# Patient Record
Sex: Female | Born: 1980 | Race: Black or African American | Hispanic: No | Marital: Married | Smoking: Current every day smoker
Health system: Southern US, Community
[De-identification: ages and names within clinical notes are randomized; demographics above are authoritative.]

## PROBLEM LIST (undated history)

## (undated) DIAGNOSIS — M542 Cervicalgia: Secondary | ICD-10-CM

## (undated) DIAGNOSIS — G8929 Other chronic pain: Secondary | ICD-10-CM

## (undated) DIAGNOSIS — R112 Nausea with vomiting, unspecified: Secondary | ICD-10-CM

## (undated) DIAGNOSIS — G459 Transient cerebral ischemic attack, unspecified: Secondary | ICD-10-CM

## (undated) DIAGNOSIS — Z9889 Other specified postprocedural states: Secondary | ICD-10-CM

## (undated) DIAGNOSIS — R0600 Dyspnea, unspecified: Secondary | ICD-10-CM

## (undated) DIAGNOSIS — Z8489 Family history of other specified conditions: Secondary | ICD-10-CM

## (undated) DIAGNOSIS — M549 Dorsalgia, unspecified: Secondary | ICD-10-CM

## (undated) DIAGNOSIS — J45909 Unspecified asthma, uncomplicated: Secondary | ICD-10-CM

## (undated) DIAGNOSIS — F419 Anxiety disorder, unspecified: Secondary | ICD-10-CM

## (undated) DIAGNOSIS — Z87442 Personal history of urinary calculi: Secondary | ICD-10-CM

## (undated) DIAGNOSIS — E039 Hypothyroidism, unspecified: Secondary | ICD-10-CM

## (undated) DIAGNOSIS — R519 Headache, unspecified: Secondary | ICD-10-CM

## (undated) DIAGNOSIS — I959 Hypotension, unspecified: Secondary | ICD-10-CM

## (undated) DIAGNOSIS — K219 Gastro-esophageal reflux disease without esophagitis: Secondary | ICD-10-CM

## (undated) DIAGNOSIS — G43909 Migraine, unspecified, not intractable, without status migrainosus: Secondary | ICD-10-CM

## (undated) DIAGNOSIS — M1990 Unspecified osteoarthritis, unspecified site: Secondary | ICD-10-CM

## (undated) HISTORY — PX: EYE SURGERY: SHX253

## (undated) HISTORY — DX: Headache, unspecified: R51.9

## (undated) HISTORY — PX: KNEE SURGERY: SHX244

## (undated) HISTORY — PX: TUBAL LIGATION: SHX77

---

## 2008-04-20 DIAGNOSIS — E890 Postprocedural hypothyroidism: Secondary | ICD-10-CM

## 2008-04-20 HISTORY — DX: Postprocedural hypothyroidism: E89.0

## 2018-04-23 ENCOUNTER — Other Ambulatory Visit: Payer: Self-pay

## 2018-04-23 ENCOUNTER — Emergency Department (HOSPITAL_COMMUNITY): Payer: Self-pay

## 2018-04-23 ENCOUNTER — Emergency Department (HOSPITAL_COMMUNITY)
Admission: EM | Admit: 2018-04-23 | Discharge: 2018-04-24 | Disposition: A | Discharge status: Home / Self Care | Payer: Self-pay | Attending: Emergency Medicine | Admitting: Emergency Medicine

## 2018-04-23 DIAGNOSIS — Z041 Encounter for examination and observation following transport accident: Secondary | ICD-10-CM | POA: Insufficient documentation

## 2018-04-23 DIAGNOSIS — M25562 Pain in left knee: Secondary | ICD-10-CM | POA: Insufficient documentation

## 2018-04-23 MED ORDER — ACETAMINOPHEN 325 MG PO TABS: 650.0000 mg | ORAL_TABLET | ORAL | Status: DC | PRN
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
## 2018-04-24 NOTE — ED Notes (Signed)
Patient verbalizes understanding of discharge instructions. Opportunity for questioning and answers were provided. Armband removed by staff, pt discharged from ED.  

## 2018-04-24 NOTE — Discharge Instructions (Signed)
For the knee pain, recommend ice, elevation, rest and tylenol or ibuprofen

## 2018-06-08 ENCOUNTER — Other Ambulatory Visit: Payer: Self-pay

## 2018-06-08 ENCOUNTER — Emergency Department (HOSPITAL_BASED_OUTPATIENT_CLINIC_OR_DEPARTMENT_OTHER)
Admission: EM | Admit: 2018-06-08 | Discharge: 2018-06-08 | Disposition: A | Discharge status: Home / Self Care | Payer: Medicaid Other | Attending: Emergency Medicine | Admitting: Emergency Medicine

## 2018-06-08 ENCOUNTER — Encounter (HOSPITAL_BASED_OUTPATIENT_CLINIC_OR_DEPARTMENT_OTHER): Payer: Self-pay | Admitting: Emergency Medicine

## 2018-06-08 ENCOUNTER — Emergency Department (HOSPITAL_BASED_OUTPATIENT_CLINIC_OR_DEPARTMENT_OTHER): Payer: Medicaid Other

## 2018-06-08 DIAGNOSIS — Z79899 Other long term (current) drug therapy: Secondary | ICD-10-CM | POA: Insufficient documentation

## 2018-06-08 DIAGNOSIS — Y9301 Activity, walking, marching and hiking: Secondary | ICD-10-CM | POA: Diagnosis not present

## 2018-06-08 DIAGNOSIS — Y929 Unspecified place or not applicable: Secondary | ICD-10-CM | POA: Insufficient documentation

## 2018-06-08 DIAGNOSIS — S8992XA Unspecified injury of left lower leg, initial encounter: Secondary | ICD-10-CM

## 2018-06-08 DIAGNOSIS — F17210 Nicotine dependence, cigarettes, uncomplicated: Secondary | ICD-10-CM | POA: Insufficient documentation

## 2018-06-08 DIAGNOSIS — M25562 Pain in left knee: Secondary | ICD-10-CM | POA: Diagnosis present

## 2018-06-08 DIAGNOSIS — W0110XA Fall on same level from slipping, tripping and stumbling with subsequent striking against unspecified object, initial encounter: Secondary | ICD-10-CM | POA: Insufficient documentation

## 2018-06-08 DIAGNOSIS — Y999 Unspecified external cause status: Secondary | ICD-10-CM | POA: Insufficient documentation

## 2018-06-08 DIAGNOSIS — Y92009 Unspecified place in unspecified non-institutional (private) residence as the place of occurrence of the external cause: Secondary | ICD-10-CM

## 2018-06-08 DIAGNOSIS — W19XXXA Unspecified fall, initial encounter: Secondary | ICD-10-CM

## 2018-06-08 HISTORY — DX: Migraine, unspecified, not intractable, without status migrainosus: G43.909

## 2018-06-08 HISTORY — DX: Transient cerebral ischemic attack, unspecified: G45.9

## 2018-06-08 HISTORY — DX: Other chronic pain: G89.29

## 2018-06-08 HISTORY — DX: Cervicalgia: M54.2

## 2018-06-08 HISTORY — DX: Dorsalgia, unspecified: M54.9

## 2018-06-08 HISTORY — DX: Hypotension, unspecified: I95.9

## 2018-06-08 MED ORDER — OXYCODONE HCL 5 MG PO TABS
10.0000 mg | ORAL_TABLET | Freq: Once | ORAL | Status: AC
  Administered 2018-06-08: 10 mg via ORAL
  Filled 2018-06-08: qty 2

## 2018-06-08 NOTE — ED Triage Notes (Signed)
Pt reports tripping and falling at 1930 yesterday, reports she tripped on a door frame and had immediate L leg pain. Reports she took some pain meds, applied ice and went to bed. Reports L leg numbness, increased pain with weight bearing.

## 2018-06-08 NOTE — ED Provider Notes (Signed)
[facility] Provider Note: Lowella Dell, MD, FACEP  CSN: [id number] MRN: [id number] ARRIVAL: 06/08/18 at 0320 ROOM: MH02/MH02   CHIEF COMPLAINT  Fall   HISTORY OF PRESENT ILLNESS  06/08/18 3:48 AM Rebecca Reilly is a 38 y.o. female on chronic oxycodone for chronic pain.  She fell yesterday evening about 7:30 PM, tripping over a threshold.  She is complaining of severe pain in her left knee.  The pain is located in the medial left knee and is worse with movement or attempted weightbearing.  She states she is not able to bear weight on it.  She is also having numbness in the left leg.  She has chronic back pain which she states has not significantly changed from her baseline.    Past Medical History:  Diagnosis Date  . Back pain, chronic    bulging disc  . Hypotension   . Migraines   . Neck pain, chronic    buldging disc  . TIA (transient ischemic attack)    Right sided weakness    Past Surgical History:  Procedure Laterality Date  . KNEE SURGERY Right     No family history on file.  Social History   Tobacco Use  . Smoking status: Current Every Day Smoker    Packs/day: 1.00    Types: Cigarettes  . Smokeless tobacco: Never Used  Substance Use Topics  . Alcohol use: Not Currently  . Drug use: Not Currently    Prior to Admission medications   Medication Sig Start Date End Date Taking? Authorizing Provider  clonazePAM (KLONOPIN) 0.5 MG tablet Take by mouth. 11/27/17  Yes [provider]  TOPIRAMATE PO Take by mouth.   Yes [provider]  diclofenac sodium (VOLTAREN) 1 % GEL APPLY 4 GRAM TO LOWER BACK AS NEEDED EVERY 8 HOURS (30 DAY SUPPLY) 03/18/18   [provider]  gabapentin (NEURONTIN) 100 MG capsule TAKE ONE CAPSULE BY MOUTH EVERY 8 HOURS 04/18/18   [provider]  Oxycodone HCl 10 MG TABS Take 10 mg by mouth every 6 (six) hours as needed. for pain 05/20/18   [provider]     Allergies Morphine   REVIEW OF SYSTEMS  Negative except as noted here or in the History of Present Illness.   PHYSICAL EXAMINATION  Initial Vital Signs Blood pressure 97/63, pulse 98, temperature 98.6 F (37 C), temperature source Oral, resp. rate 20, height 5\' 7"  (1.702 m), weight 87.5 kg, last menstrual period 05/25/2018, SpO2 98 %.  Examination General: Well-developed, well-nourished female in no acute distress; appearance consistent with age of record HENT: normocephalic; atraumatic Eyes: pupils equal, round and reactive to light; extraocular muscles intact Neck: supple Heart: regular rate and rhythm; no murmurs, rubs or gallops Lungs: clear to auscultation bilaterally Abdomen: soft; nondistended; nontender; bowel sounds present Extremities: No deformity; tenderness of medial left knee with pain on attempted range of motion, no ecchymosis or swelling appreciated; pulses normal Neurologic: Awake, alert and oriented; motor function intact in all extremities and symmetric; no facial droop Skin: Warm and dry Psychiatric: Flat affect   RESULTS  Summary of this visit's results, reviewed by myself:   EKG Interpretation  Date/Time:    Ventricular Rate:    PR Interval:    QRS Duration:   QT Interval:    QTC Calculation:   R Axis:     Text Interpretation:        Laboratory Studies: No results found for this or any previous visit (from  the past 24 hour(s)). Imaging Studies: Dg Knee Complete 4 Views Left  Result Date: 06/08/2018 CLINICAL DATA:  Fall 9 hours ago with left knee pain. EXAM: LEFT KNEE - COMPLETE 4+ VIEW COMPARISON:  04/23/2018 FINDINGS: No evidence of acute fracture, dislocation, or joint effusion. On the lateral view 1 of the anterior femoral condyle shows a concavity that is chronic. No evidence of arthropathy or other focal bone abnormality. Soft tissues are unremarkable. IMPRESSION: 1. No acute finding. 2. Concavity of 1 of the femoral condyles  anteriorly which could be developmental or from a remote impaction injury. Electronically Signed   By: Marnee Spring M.D.   On: 06/08/2018 04:30    ED COURSE and MDM  Nursing notes and initial vitals signs, including pulse oximetry, reviewed.  Vitals:   06/08/18 0325 06/08/18 0330  BP: 97/63   Pulse: 98   Resp: 20   Temp: 98.6 F (37 C)   TempSrc: Oral   SpO2: 98%   Weight:  87.5 kg  Height:  5\' 7"  (1.702 m)   Patient is already on chronic narcotics and understands that she cannot get any additional prescriptions.  We will try a knee immobilizer and crutches to help her ambulate.  She already has a referral to an orthopedist, appointment pending.  PROCEDURES    ED DIAGNOSES     ICD-10-CM   1. Fall in home, initial encounter W19.XXXA    Y92.009   2. Left knee injury, initial encounter S89.92XA        Hazley Dezeeuw, Jonny Ruiz, MD 06/08/18 [phone]

## 2018-06-08 NOTE — ED Notes (Signed)
Patient transported to X-ray 

## 2018-08-19 ENCOUNTER — Other Ambulatory Visit: Payer: Self-pay | Admitting: Nurse Practitioner

## 2018-10-27 ENCOUNTER — Other Ambulatory Visit: Payer: Medicaid Other

## 2018-11-19 ENCOUNTER — Other Ambulatory Visit: Payer: Self-pay

## 2018-11-19 ENCOUNTER — Other Ambulatory Visit: Payer: Medicaid Other

## 2018-12-13 ENCOUNTER — Other Ambulatory Visit: Payer: Self-pay

## 2018-12-13 ENCOUNTER — Ambulatory Visit
Admission: RE | Admit: 2018-12-13 | Discharge: 2018-12-13 | Disposition: A | Discharge status: Home / Self Care | Payer: Medicaid Other | Source: Ambulatory Visit | Attending: Nurse Practitioner | Admitting: Nurse Practitioner

## 2018-12-14 ENCOUNTER — Other Ambulatory Visit: Payer: Medicaid Other

## 2018-12-14 ENCOUNTER — Ambulatory Visit
Admission: RE | Admit: 2018-12-14 | Discharge: 2018-12-14 | Disposition: A | Discharge status: Home / Self Care | Payer: Medicaid Other | Source: Ambulatory Visit | Attending: Nurse Practitioner | Admitting: Nurse Practitioner

## 2019-01-09 DIAGNOSIS — Z5321 Procedure and treatment not carried out due to patient leaving prior to being seen by health care provider: Secondary | ICD-10-CM | POA: Insufficient documentation

## 2019-01-09 DIAGNOSIS — N200 Calculus of kidney: Secondary | ICD-10-CM | POA: Diagnosis not present

## 2019-01-10 ENCOUNTER — Emergency Department (HOSPITAL_COMMUNITY)
Admission: EM | Admit: 2019-01-10 | Discharge: 2019-01-10 | Disposition: A | Discharge status: Home / Self Care | Payer: Medicaid Other | Attending: Emergency Medicine | Admitting: Emergency Medicine

## 2019-01-10 NOTE — ED Notes (Signed)
PT stated she would go somewhere else because nobody has come to triage her. [facility] and to for her husband to take her somewhere else.

## 2019-01-11 ENCOUNTER — Emergency Department (HOSPITAL_COMMUNITY)
Admission: EM | Admit: 2019-01-11 | Discharge: 2019-01-11 | Disposition: A | Discharge status: Home / Self Care | Payer: Medicaid Other | Attending: Emergency Medicine | Admitting: Emergency Medicine

## 2019-01-11 ENCOUNTER — Encounter (HOSPITAL_COMMUNITY): Payer: Self-pay | Admitting: Emergency Medicine

## 2019-01-11 ENCOUNTER — Emergency Department (HOSPITAL_COMMUNITY)
Admission: EM | Admit: 2019-01-11 | Discharge: 2019-01-11 | Discharge status: Against Medical Advice | Payer: Medicaid Other | Source: Home / Self Care

## 2019-01-11 ENCOUNTER — Other Ambulatory Visit: Payer: Self-pay

## 2019-01-11 ENCOUNTER — Emergency Department (HOSPITAL_COMMUNITY): Payer: Medicaid Other

## 2019-01-11 DIAGNOSIS — F17210 Nicotine dependence, cigarettes, uncomplicated: Secondary | ICD-10-CM | POA: Diagnosis not present

## 2019-01-11 DIAGNOSIS — Z5321 Procedure and treatment not carried out due to patient leaving prior to being seen by health care provider: Secondary | ICD-10-CM | POA: Insufficient documentation

## 2019-01-11 DIAGNOSIS — R110 Nausea: Secondary | ICD-10-CM | POA: Insufficient documentation

## 2019-01-11 DIAGNOSIS — R1032 Left lower quadrant pain: Secondary | ICD-10-CM | POA: Diagnosis present

## 2019-01-11 DIAGNOSIS — Z79899 Other long term (current) drug therapy: Secondary | ICD-10-CM | POA: Insufficient documentation

## 2019-01-11 DIAGNOSIS — N390 Urinary tract infection, site not specified: Secondary | ICD-10-CM | POA: Insufficient documentation

## 2019-01-11 DIAGNOSIS — R10812 Left upper quadrant abdominal tenderness: Secondary | ICD-10-CM | POA: Insufficient documentation

## 2019-01-11 LAB — CBC WITH DIFFERENTIAL/PLATELET
Abs Immature Granulocytes: 0.03 10*3/uL (ref 0.00–0.07)
Basophils Absolute: 0 10*3/uL (ref 0.0–0.1)
Basophils Relative: 0 %
Eosinophils Absolute: 0.3 10*3/uL (ref 0.0–0.5)
Eosinophils Relative: 2 %
HCT: 43.4 % (ref 36.0–46.0)
Hemoglobin: 14 g/dL (ref 12.0–15.0)
Immature Granulocytes: 0 %
Lymphocytes Relative: 23 %
Lymphs Abs: 2.8 10*3/uL (ref 0.7–4.0)
MCH: 32 pg (ref 26.0–34.0)
MCHC: 32.3 g/dL (ref 30.0–36.0)
MCV: 99.1 fL (ref 80.0–100.0)
Monocytes Absolute: 1 10*3/uL (ref 0.1–1.0)
Monocytes Relative: 8 %
Neutro Abs: 8.3 10*3/uL — ABNORMAL HIGH (ref 1.7–7.7)
Neutrophils Relative %: 67 %
Platelets: 231 10*3/uL (ref 150–400)
RBC: 4.38 MIL/uL (ref 3.87–5.11)
RDW: 12.9 % (ref 11.5–15.5)
WBC: 12.5 10*3/uL — ABNORMAL HIGH (ref 4.0–10.5)
nRBC: 0 % (ref 0.0–0.2)

## 2019-01-11 LAB — BASIC METABOLIC PANEL
Anion gap: 8 (ref 5–15)
BUN: 15 mg/dL (ref 6–20)
CO2: 19 mmol/L — ABNORMAL LOW (ref 22–32)
Calcium: 9.1 mg/dL (ref 8.9–10.3)
Chloride: 110 mmol/L (ref 98–111)
Creatinine, Ser: 1 mg/dL (ref 0.44–1.00)
GFR calc Af Amer: >60 mL/min (ref >60)
GFR calc non Af Amer: >60 mL/min (ref >60)
Glucose, Bld: 113 mg/dL — ABNORMAL HIGH (ref 70–99)
Potassium: 4 mmol/L (ref 3.5–5.1)
Sodium: 137 mmol/L (ref 135–145)

## 2019-01-11 LAB — HEPATIC FUNCTION PANEL
ALT: 11 U/L (ref 0–44)
AST: 16 U/L (ref 15–41)
Albumin: 3.6 g/dL (ref 3.5–5.0)
Alkaline Phosphatase: 71 U/L (ref 38–126)
Bilirubin, Direct: 0.1 mg/dL (ref 0.0–0.2)
Indirect Bilirubin: 0.2 mg/dL — ABNORMAL LOW (ref 0.3–0.9)
Total Bilirubin: 0.3 mg/dL (ref 0.3–1.2)
Total Protein: 6.6 g/dL (ref 6.5–8.1)

## 2019-01-11 LAB — URINALYSIS, ROUTINE W REFLEX MICROSCOPIC
Bilirubin Urine: NEGATIVE
Glucose, UA: NEGATIVE mg/dL
Ketones, ur: NEGATIVE mg/dL
Nitrite: NEGATIVE
Protein, ur: NEGATIVE mg/dL
Specific Gravity, Urine: 1.029 (ref 1.005–1.030)
pH: 5 (ref 5.0–8.0)

## 2019-01-11 LAB — I-STAT BETA HCG BLOOD, ED (MC, WL, AP ONLY): I-stat hCG, quantitative: <5 m[IU]/mL (ref <5)

## 2019-01-11 LAB — LIPASE, BLOOD: Lipase: 42 U/L (ref 11–51)

## 2019-01-11 MED ORDER — PROCHLORPERAZINE EDISYLATE 10 MG/2ML IJ SOLN
10.0000 mg | Freq: Once | INTRAMUSCULAR | Status: AC
  Administered 2019-01-11: 09:00:00 10 mg via INTRAVENOUS
  Filled 2019-01-11: qty 2

## 2019-01-11 MED ORDER — SODIUM CHLORIDE 0.9 % IV BOLUS
500.0000 mL | Freq: Once | INTRAVENOUS | Status: AC
  Administered 2019-01-11: 500 mL via INTRAVENOUS

## 2019-01-11 MED ORDER — CEPHALEXIN 500 MG PO CAPS
500.0000 mg | ORAL_CAPSULE | Freq: Once | ORAL | Status: AC
  Administered 2019-01-11: 11:00:00 500 mg via ORAL
  Filled 2019-01-11: qty 1

## 2019-01-11 MED ORDER — SODIUM CHLORIDE 0.9 % IV BOLUS
1000.0000 mL | Freq: Once | INTRAVENOUS | Status: AC
  Administered 2019-01-11: 09:00:00 1000 mL via INTRAVENOUS

## 2019-01-11 MED ORDER — CEPHALEXIN 500 MG PO CAPS: 500.0000 mg | ORAL_CAPSULE | Freq: Two times a day (BID) | ORAL | 0 refills | Status: AC

## 2019-01-11 MED ORDER — HYDROMORPHONE HCL 1 MG/ML IJ SOLN
1.0000 mg | Freq: Once | INTRAMUSCULAR | Status: AC
  Administered 2019-01-11: 1 mg via INTRAVENOUS
  Filled 2019-01-11: qty 1

## 2019-01-11 NOTE — ED Triage Notes (Signed)
Pt having abd pains that radiates to left leg x 2 days with nausea.

## 2019-01-11 NOTE — ED Triage Notes (Signed)
Per [facility] pt from home for a syncopal episode while sitting on the toilet today. Pt has hematoma to right side of head, hip pains-right.  Vitals: 122 palpated, cbg 99, 97 temp, HR 60.

## 2019-01-11 NOTE — ED Notes (Signed)
Curat[place],MD made aware of patients low BP. 80's/60's.

## 2019-01-11 NOTE — ED Provider Notes (Signed)
[facility]-EMERGENCY DEPT Provider Note   CSN: [id number] Arrival date & time: 01/11/19  7106     History   Chief Complaint Chief Complaint  Patient presents with  . Abdominal Pain    HPI Rebecca Reilly is a 38 y.o. female.     The history is provided by the patient.  Abdominal Pain Pain location:  L flank, LUQ and LLQ Pain quality: aching and dull   Pain radiates to:  L leg Pain severity:  Mild Onset quality:  Gradual Duration:  3 days Timing:  Intermittent Progression:  Waxing and waning Chronicity:  New Context: not eating and not recent illness   Relieved by:  OTC medications (home narcotics) Worsened by:  Movement and palpation Associated symptoms: nausea   Associated symptoms: no chest pain, no chills, no constipation, no cough, no diarrhea, no dysuria, no fever, no hematuria, no shortness of breath, no sore throat and no vomiting   Risk factors: has not had multiple surgeries and not pregnant     Past Medical History:  Diagnosis Date  . Back pain, chronic    bulging disc  . Hypotension   . Migraines   . Neck pain, chronic    buldging disc  . TIA (transient ischemic attack)    Right sided weakness    There are no active problems to display for this patient.   Past Surgical History:  Procedure Laterality Date  . KNEE SURGERY Right      OB History   No obstetric history on file.      Home Medications    Prior to Admission medications   Medication Sig Start Date End Date Taking? Authorizing Provider  acetaminophen (TYLENOL) 500 MG tablet Take 500 mg by mouth every 6 (six) hours as needed for mild pain.   Yes [provider]  gabapentin (NEURONTIN) 100 MG capsule Take 100 mg by mouth 3 (three) times daily.  04/18/18  Yes [provider]  ibuprofen (ADVIL) 200 MG tablet Take 800 mg by mouth every 8 (eight) hours as needed for pain.   Yes [provider]  levalbuterol (XOPENEX HFA) 45 MCG/ACT  inhaler Inhale 1 puff into the lungs every 4 (four) hours as needed for wheezing.   Yes [provider]  Oxycodone HCl 10 MG TABS Take 10 mg by mouth every 6 (six) hours as needed (for pain).  05/20/18  Yes [provider]  cephALEXin (KEFLEX) 500 MG capsule Take 1 capsule (500 mg total) by mouth 2 (two) times daily for 5 days. 01/11/19 01/16/19  Virgina Norfolk, DO    Family History No family history on file.  Social History Social History   Tobacco Use  . Smoking status: Current Every Day Smoker    Packs/day: 1.00    Types: Cigarettes  . Smokeless tobacco: Never Used  Substance Use Topics  . Alcohol use: Not Currently  . Drug use: Not Currently     Allergies   Morphine   Review of Systems Review of Systems  Constitutional: Negative for chills and fever.  HENT: Negative for ear pain and sore throat.   Eyes: Negative for pain and visual disturbance.  Respiratory: Negative for cough and shortness of breath.   Cardiovascular: Negative for chest pain and palpitations.  Gastrointestinal: Positive for abdominal pain and nausea. Negative for abdominal distention, anal bleeding, blood in stool, constipation, diarrhea and vomiting.  Genitourinary: Negative for dysuria and hematuria.  Musculoskeletal: Negative for arthralgias and back pain.  Skin:  Negative for color change and rash.  Neurological: Negative for seizures and syncope.  All other systems reviewed and are negative.    Physical Exam Updated Vital Signs  ED Triage Vitals  Enc Vitals Group     BP 01/11/19 0754 101/72     Pulse Rate 01/11/19 0754 81     Resp 01/11/19 0754 18     Temp 01/11/19 0756 98.3 F (36.8 C)     Temp Source 01/11/19 0756 Oral     SpO2 01/11/19 0754 98 %     Weight --      Height --      Head Circumference --      Peak Flow --      Pain Score 01/11/19 0755 10     Pain Loc --      Pain Edu? --      Excl. in [place]? --     Physical Exam Vitals signs and nursing note  reviewed.  Constitutional:      General: She is not in acute distress.    Appearance: She is well-developed.  HENT:     Head: Normocephalic and atraumatic.  Eyes:     Extraocular Movements: Extraocular movements intact.     Conjunctiva/sclera: Conjunctivae normal.     Pupils: Pupils are equal, round, and reactive to light.  Neck:     Musculoskeletal: Neck supple.  Cardiovascular:     Rate and Rhythm: Normal rate and regular rhythm.     Heart sounds: Normal heart sounds. No murmur.  Pulmonary:     Effort: Pulmonary effort is normal. No respiratory distress.     Breath sounds: Normal breath sounds.  Abdominal:     General: Abdomen is flat. Bowel sounds are normal. There is no distension.     Palpations: Abdomen is soft.     Tenderness: There is abdominal tenderness in the left upper quadrant and left lower quadrant. There is left CVA tenderness. There is no right CVA tenderness or guarding. Negative signs include Murphy's sign, McBurney's sign and psoas sign.  Skin:    General: Skin is warm and dry.  Neurological:     Mental Status: She is alert.      ED Treatments / Results  Labs (all labs ordered are listed, but only abnormal results are displayed) Labs Reviewed  CBC WITH DIFFERENTIAL/PLATELET - Abnormal; Notable for the following components:      Result Value   WBC 12.5 (*)    Neutro Abs 8.3 (*)    All other components within normal limits  BASIC METABOLIC PANEL - Abnormal; Notable for the following components:   CO2 19 (*)    Glucose, Bld 113 (*)    All other components within normal limits  HEPATIC FUNCTION PANEL - Abnormal; Notable for the following components:   Indirect Bilirubin 0.2 (*)    All other components within normal limits  URINALYSIS, ROUTINE W REFLEX MICROSCOPIC - Abnormal; Notable for the following components:   APPearance CLOUDY (*)    Hgb urine dipstick SMALL (*)    Leukocytes,Ua SMALL (*)    Bacteria, UA MANY (*)    All other components within  normal limits  URINE CULTURE  LIPASE, BLOOD  I-STAT BETA HCG BLOOD, ED ([facility], [facility], [facility] ONLY)    EKG None  Radiology Ct Renal Stone Study  Result Date: 01/11/2019 CLINICAL DATA:  Flank pain. Left-sided flank pain that radiates to left lower extremity. EXAM: CT ABDOMEN AND PELVIS WITHOUT CONTRAST TECHNIQUE: Multidetector CT  imaging of the abdomen and pelvis was performed following the standard protocol without IV contrast. COMPARISON:  None. FINDINGS: Lower chest: The lung bases are clear. The heart size is normal. Hepatobiliary: The liver is normal. Normal gallbladder.There is no biliary ductal dilation. Pancreas: Normal contours without ductal dilatation. No peripancreatic fluid collection. Spleen: No splenic laceration or hematoma. Adrenals/Urinary Tract: --Adrenal glands: No adrenal hemorrhage. --Right kidney/ureter: No hydronephrosis or perinephric hematoma. --Left kidney/ureter: No hydronephrosis or perinephric hematoma. --Urinary bladder: Unremarkable. Stomach/Bowel: --Stomach/Duodenum: No hiatal hernia or other gastric abnormality. Normal duodenal course and caliber. --Small bowel: No dilatation or inflammation. --Colon: No focal abnormality. --Appendix: Normal. Vascular/Lymphatic: Normal course and caliber of the major abdominal vessels. --No retroperitoneal lymphadenopathy. --No mesenteric lymphadenopathy. --No pelvic or inguinal lymphadenopathy. Reproductive: There are multiple low-attenuation areas in the uterine fundus favored to represent fibroids. Other: No ascites or free air. The abdominal wall is normal. Musculoskeletal. No acute displaced fractures. IMPRESSION: 1. No acute abnormality detected. No radiopaque kidney stones. No hydronephrosis. 2. Normal appendix in the right lower quadrant. 3. Fibroid uterus. Electronically Signed   By: Katherine Mantle M.D.   On: 01/11/2019 10:23    Procedures Procedures (including critical care time)  Medications Ordered in ED Medications  sodium  chloride 0.9 % bolus 1,000 mL (0 mLs Intravenous Stopped 01/11/19 0946)  prochlorperazine (COMPAZINE) injection 10 mg (10 mg Intravenous Given 01/11/19 0843)  HYDROmorphone (DILAUDID) injection 1 mg (1 mg Intravenous Given 01/11/19 0838)  sodium chloride 0.9 % bolus 500 mL (0 mLs Intravenous Stopped 01/11/19 1135)  cephALEXin (KEFLEX) capsule 500 mg (500 mg Oral Given 01/11/19 1057)     Initial Impression / Assessment and Plan / ED Course  I have reviewed the triage vital signs and the nursing notes.  Pertinent labs & imaging results that were available during my care of the patient were reviewed by me and considered in my medical decision making (see chart for details).     Rebecca Reilly is a 38 year old female with history of chronic neck, chronic back pain, IBS who presents to the ED with left-sided abdominal pain for the last several days.  Patient also with history of kidney stones.  Patient denies any urinary symptoms but has had nausea.  No hematuria.  Pain intermittent on the left side sometimes radiates into her left leg but she states that could be from her chronic back pain.  She denies any loss of bowel or bladder.  No worsening back pain than normal.  No vaginal discharge or concern for STDs.  Patient with tenderness most with the left side of the abdomen and left CVA.  Possibly kidney stone versus pyelonephritis versus colitis versus bowel obstruction.  Could be IBS flare.  Patient is on her menstrual cycle and states that she does get pretty bad cramps that time as well but seems a little bit stronger than normal.  Will evaluate with lab work, CT scan of the abdomen and pelvis.  Will give IV Compazine, IV Dilaudid, normal saline bolus.  Patient has tried Tylenol, Motrin, home narcotics with minimal relief.  Will reevaluate.]  Urinalysis possibly with infection.  Will treat with Keflex.  However no signs of systemic infection.  No fever.  Mild leukocytosis at 12.5.  However kidney function  within normal limits.  No significant electrolyte abnormalities.  No significant anemia.  Pregnancy test negative. CT unremarkable except for fibroid. Possible UTI and will treat. Patient felt better after fluids and pain medicine. Discharged in good condition, had one  low BP but was positional, asymptomic and normal when cuff adjusted. Recommend follow up with PCP.   This chart was dictated using voice recognition software.  Despite best efforts to proofread,  errors can occur which can change the documentation meaning.    Final Clinical Impressions(s) / ED Diagnoses   Final diagnoses:  Urinary tract infection without hematuria, site unspecified    ED Discharge Orders         Ordered    cephALEXin (KEFLEX) 500 MG capsule  2 times daily     01/11/19 0913           Virgina NorfolkCuratolo, Vera Wishart, DO 01/11/19 1226

## 2019-01-11 NOTE — ED Notes (Signed)
Lab called to add on urine culture.  ?

## 2019-01-11 NOTE — ED Notes (Signed)
PT WALKED UP TO WINDOW. PT ENDORSES HER PCP IS GOING TO SEE HER RIGHT NOW AND REQUEST TO LEAVE. SHE IS NOT UPSET AND UNDERSTANDS OUR WAIT. SHE IS ALERT AND ACTIVE WITH CARE AND HAS A RIDE.

## 2019-01-11 NOTE — ED Notes (Signed)
CT at bedside for transport

## 2019-01-12 LAB — URINE CULTURE

## 2019-01-12 LAB — CBG MONITORING, ED: Glucose-Capillary: 100 mg/dL — ABNORMAL HIGH (ref 70–99)

## 2019-02-03 ENCOUNTER — Other Ambulatory Visit: Payer: Self-pay | Admitting: Neurosurgery

## 2019-02-28 NOTE — Pre-Procedure Instructions (Signed)
Rebecca Reilly [facility] [address] [place] - [address] [place] [address] Phone: [phone] Fax: [phone]      Your procedure is scheduled on Friday, November, 13th.  Report to [facility] Main Entrance "A" at 05:30 A.M., and check in at the Admitting office.  Call this number if you have problems the morning of surgery:  [phone]    Remember:  Do not eat or drink after midnight the night before your surgery     Take these medicines the morning of surgery with A SIP OF WATER  IF NEEDED: acetaminophen (TYLENOL)  gabapentin (NEURONTIN)  Oxycodone HCl  DUREZOL 0.05 % Eye drop ketorolac (ACULAR) 0.5 % ophthalmic solution VIGAMOX 0.5 % ophthalmic solution  levalbuterol (XOPENEX HFA) inhaler Please bring all inhalers with you the day of surgery.     As of today, STOP taking any Aspirin (unless otherwise instructed by your surgeon), Aleve, Naproxen, Ibuprofen, Motrin, Advil, Goody's, BC's, all herbal medications, fish oil, and all vitamins.    The Morning of Surgery  Do not wear jewelry, make-up or nail polish.  Do not wear lotions, powders, perfumes, or deodorant  Do not shave 48 hours prior to surgery.    Do not bring valuables to the hospital.  [facility] is not responsible for any belongings or valuables.  If you are a smoker, DO NOT Smoke 24 hours prior to surgery IF you wear a CPAP at night please bring your mask, tubing, and machine the morning of surgery   Remember that you must have someone to transport you home after your surgery, and remain with you for 24 hours if you are discharged the same day.   Contacts, glasses, hearing aids, dentures or bridgework may not be worn into surgery.    Leave your suitcase in the car.  After surgery it may be brought to your room.  For patients admitted to the hospital, discharge time will be determined by your treatment team.  Patients discharged the day of surgery will not be  allowed to drive home.    Special instructions:   [facility]- Preparing For Surgery  Before surgery, you can play an important role. Because skin is not sterile, your skin needs to be as free of germs as possible. You can reduce the number of germs on your skin by washing with CHG (chlorahexidine gluconate) Soap before surgery.  CHG is an antiseptic cleaner which kills germs and bonds with the skin to continue killing germs even after washing.    Oral Hygiene is also important to reduce your risk of infection.  Remember - BRUSH YOUR TEETH THE MORNING OF SURGERY WITH YOUR REGULAR TOOTHPASTE  Please do not use if you have an allergy to CHG or antibacterial soaps. If your skin becomes reddened/irritated stop using the CHG.  Do not shave (including legs and underarms) for at least 48 hours prior to first CHG shower. It is OK to shave your face.  Please follow these instructions carefully.   1. Shower the NIGHT BEFORE SURGERY and the MORNING OF SURGERY with CHG Soap.   2. If you chose to wash your hair, wash your hair first as usual with your normal shampoo.  3. After you shampoo, rinse your hair and body thoroughly to remove the shampoo.  4. Use CHG as you would any other liquid soap. You can apply CHG directly to the skin and wash gently with a scrungie or a clean washcloth.   5.  Apply the CHG Soap to your body ONLY FROM THE NECK DOWN.  Do not use on open wounds or open sores. Avoid contact with your eyes, ears, mouth and genitals (private parts). Wash Face and genitals (private parts)  with your normal soap.   6. Wash thoroughly, paying special attention to the area where your surgery will be performed.  7. Thoroughly rinse your body with warm water from the neck down.  8. DO NOT shower/wash with your normal soap after using and rinsing off the CHG Soap.  9. Pat yourself dry with a CLEAN TOWEL.  10. Wear CLEAN PAJAMAS to bed the night before surgery, wear comfortable clothes the  morning of surgery  11. Place CLEAN SHEETS on your bed the night of your first shower and DO NOT SLEEP WITH PETS.    Day of Surgery:  Do not apply any deodorants/lotions. Please shower the morning of surgery with the CHG soap  Please wear clean clothes to the hospital/surgery center.   Remember to brush your teeth WITH YOUR REGULAR TOOTHPASTE.   Please read over the following fact sheets that you were given.

## 2019-03-01 ENCOUNTER — Encounter (HOSPITAL_COMMUNITY)
Admission: RE | Admit: 2019-03-01 | Discharge: 2019-03-01 | Disposition: A | Discharge status: Home / Self Care | Payer: Medicaid Other | Source: Ambulatory Visit | Attending: Neurosurgery | Admitting: Neurosurgery

## 2019-03-01 ENCOUNTER — Other Ambulatory Visit (HOSPITAL_COMMUNITY)
Admission: RE | Admit: 2019-03-01 | Discharge: 2019-03-01 | Disposition: A | Discharge status: Home / Self Care | Payer: Medicaid Other | Source: Ambulatory Visit | Attending: Neurosurgery | Admitting: Neurosurgery

## 2019-03-01 ENCOUNTER — Other Ambulatory Visit: Payer: Self-pay

## 2019-03-01 ENCOUNTER — Encounter (HOSPITAL_COMMUNITY): Payer: Self-pay

## 2019-03-01 DIAGNOSIS — Z01812 Encounter for preprocedural laboratory examination: Secondary | ICD-10-CM | POA: Diagnosis present

## 2019-03-01 HISTORY — DX: Unspecified osteoarthritis, unspecified site: M19.90

## 2019-03-01 HISTORY — DX: Dyspnea, unspecified: R06.00

## 2019-03-01 HISTORY — DX: Other specified postprocedural states: Z98.890

## 2019-03-01 HISTORY — DX: Gastro-esophageal reflux disease without esophagitis: K21.9

## 2019-03-01 HISTORY — DX: Unspecified asthma, uncomplicated: J45.909

## 2019-03-01 HISTORY — DX: Family history of other specified conditions: Z84.89

## 2019-03-01 HISTORY — DX: Nausea with vomiting, unspecified: R11.2

## 2019-03-01 HISTORY — DX: Personal history of urinary calculi: Z87.442

## 2019-03-01 HISTORY — DX: Anxiety disorder, unspecified: F41.9

## 2019-03-01 HISTORY — DX: Hypothyroidism, unspecified: E03.9

## 2019-03-01 LAB — CBC
HCT: 46.9 % — ABNORMAL HIGH (ref 36.0–46.0)
Hemoglobin: 15.4 g/dL — ABNORMAL HIGH (ref 12.0–15.0)
MCH: 32.2 pg (ref 26.0–34.0)
MCHC: 32.8 g/dL (ref 30.0–36.0)
MCV: 98.1 fL (ref 80.0–100.0)
Platelets: 241 10*3/uL (ref 150–400)
RBC: 4.78 MIL/uL (ref 3.87–5.11)
RDW: 13.2 % (ref 11.5–15.5)
WBC: 10.8 10*3/uL — ABNORMAL HIGH (ref 4.0–10.5)
nRBC: 0 % (ref 0.0–0.2)

## 2019-03-01 LAB — BASIC METABOLIC PANEL
Anion gap: 7 (ref 5–15)
BUN: 13 mg/dL (ref 6–20)
CO2: 22 mmol/L (ref 22–32)
Calcium: 9.4 mg/dL (ref 8.9–10.3)
Chloride: 108 mmol/L (ref 98–111)
Creatinine, Ser: 0.89 mg/dL (ref 0.44–1.00)
GFR calc Af Amer: >60 mL/min (ref >60)
GFR calc non Af Amer: >60 mL/min (ref >60)
Glucose, Bld: 90 mg/dL (ref 70–99)
Potassium: 4.2 mmol/L (ref 3.5–5.1)
Sodium: 137 mmol/L (ref 135–145)

## 2019-03-01 LAB — SARS CORONAVIRUS 2 (TAT 6-24 HRS): SARS Coronavirus 2: NEGATIVE

## 2019-03-01 LAB — SURGICAL PCR SCREEN
MRSA, PCR: NEGATIVE
Staphylococcus aureus: NEGATIVE

## 2019-03-01 NOTE — Progress Notes (Signed)
PCP - Sandi Mariscal, MD [facility] [address]) Cardiologist -  Denies Pain Specialist- Jeanella Anton, NP-C [facility] on [address])  PPM/ICD - Denies  Chest x-ray - N/A EKG - 01/12/2019 Stress Test - Per patient, 2017 at [facility] (record requested) ECHO - Per patient, 2017 at [facility] (record requested) Cardiac Cath - Denies  Sleep Study - Denies  Patient denies being a diabetic  Blood Thinner Instructions: N/A Aspirin Instructions: N/A  ERAS Protcol - None PRE-SURGERY Ensure or G2- N/A  Anesthesia review: Yes, requested Echo & stress test from [place] TEST- 03/01/2019 Patient denies shortness of breath, fever, cough and chest pain at PAT appointment  Coronavirus Screening  Have you experienced the following symptoms:  Cough yes/no: No Fever (>100.54F)  yes/no: No Runny nose yes/no: No Sore throat yes/no: No Difficulty breathing/shortness of breath  yes/no: No Have you or a family member traveled in the last 14 days and where? yes/no: No   If the patient indicates "YES" to the above questions, their PAT will be rescheduled to limit the exposure to others and, the surgeon will be notified. THE PATIENT WILL NEED TO BE ASYMPTOMATIC FOR 14 DAYS.   If the patient is not experiencing any of these symptoms, the PAT nurse will instruct them to NOT bring anyone with them to their appointment since they may have these symptoms or traveled as well.   Please remind your patients and families that hospital visitation restrictions are in effect and the importance of the restrictions.    All instructions explained to the patient, with a verbal understanding of the material. Patient agrees to go over the instructions while at home for a better understanding. Patient also instructed to self quarantine after being tested for COVID-19. The opportunity to ask questions was provided.

## 2019-03-02 NOTE — Progress Notes (Signed)
Anesthesia Chart Review: Pt reported history of echo and stress test done at [facility] did not have record of this. Pt states these tests were done during workup for CVA and she recalls being told they were normal. She continues to have R sided weakness. She also reports she has severe GERD that is currently not treated due to recent lapse in insurance, she was not able to afford PPI. Currently she has daily reflux that worsens when supine. She also reports history of severe PONV; unsure if she has ever been given scopolamine patch before.  Pt's PCP notes were requested/reviewed. No history of CAD or other cardiovascular disease noted.   EKG 01/11/19: Sinus rhythm.  Rate 68.  Low voltage, precordial leads. Borderline T abnormalities, anterior leads.   Preop labs are WNL.    Wynonia Musty [facility]/Anesthesiology Phone [phone] 03/02/2019 10:05 AM

## 2019-03-02 NOTE — Anesthesia Preprocedure Evaluation (Addendum)
Anesthesia Evaluation  Patient identified by MRN, date of birth, ID band Patient awake    Reviewed: Allergy & Precautions, NPO status , Patient's Chart, lab work & pertinent test results  History of Anesthesia Complications (+) PONV  Airway Mallampati: II  TM Distance: >3 FB Neck ROM: Full    Dental no notable dental hx. (+) Teeth Intact   Pulmonary shortness of breath, asthma , Current SmokerPatient did not abstain from smoking.,    Pulmonary exam normal breath sounds clear to auscultation       Cardiovascular Exercise Tolerance: Good Normal cardiovascular exam Rhythm:Regular Rate:Normal     Neuro/Psych  Headaches, Anxiety TIA   GI/Hepatic Neg liver ROS, GERD  ,  Endo/Other  Hypothyroidism   Renal/GU K+ 4.2 Cr 0.89     Musculoskeletal  (+) Arthritis ,   Abdominal (+) + obese,   Peds  Hematology Hgb 15.4 Plt 241   Anesthesia Other Findings   Reproductive/Obstetrics                         Anesthesia Physical Anesthesia Plan  ASA: II  Anesthesia Plan: General   Post-op Pain Management:    Induction: Intravenous  PONV Risk Score and Plan: 4 or greater and Treatment may vary due to age or medical condition, Ondansetron, Dexamethasone, Scopolamine patch - Pre-op and Midazolam  Airway Management Planned: Oral ETT  Additional Equipment: Arterial line  Intra-op Plan:   Post-operative Plan: Extubation in OR  Informed Consent: I have reviewed the patients History and Physical, chart, labs and discussed the procedure including the risks, benefits and alternatives for the proposed anesthesia with the patient or authorized representative who has indicated his/her understanding and acceptance.     Dental advisory given  Plan Discussed with: CRNA  Anesthesia Plan Comments: (Pt reported history of echo and stress test done at [place] did not have record of  this. Pt states these tests were done during workup for CVA and she recalls being told they were normal. She continues to have R sided weakness. She also reports she has severe GERD that is currently not treated due to recent lapse in insurance, she was not able to afford PPI. Currently she has daily reflux that worsens when supine. She also reports history of severe PONV; unsure if she has ever been given scopolamine patch before.  Pt's PCP notes were requested/reviewed. No history of CAD or other cardiovascular disease noted.   EKG 01/11/19: Sinus rhythm.  Rate 68.  Low voltage, precordial leads. Borderline T abnormalities, anterior leads.   Preop labs are WNL. )     Anesthesia Quick Evaluation

## 2019-03-03 ENCOUNTER — Other Ambulatory Visit: Payer: Self-pay

## 2019-03-03 ENCOUNTER — Inpatient Hospital Stay (HOSPITAL_COMMUNITY)
Admission: RE | Admit: 2019-03-03 | Discharge: 2019-03-05 | DRG: 027 | Disposition: A | Discharge status: Home / Self Care | Payer: Medicaid Other | Attending: Neurosurgery | Admitting: Neurosurgery

## 2019-03-03 ENCOUNTER — Encounter (HOSPITAL_COMMUNITY): Payer: Self-pay | Admitting: *Deleted

## 2019-03-03 ENCOUNTER — Encounter (HOSPITAL_COMMUNITY)
Admission: RE | Disposition: A | Discharge status: Home / Self Care | Payer: Self-pay | Source: Home / Self Care | Attending: Neurosurgery

## 2019-03-03 ENCOUNTER — Inpatient Hospital Stay (HOSPITAL_COMMUNITY): Payer: Medicaid Other | Admitting: Physician Assistant

## 2019-03-03 ENCOUNTER — Inpatient Hospital Stay (HOSPITAL_COMMUNITY): Payer: Medicaid Other | Admitting: Certified Registered"

## 2019-03-03 DIAGNOSIS — K219 Gastro-esophageal reflux disease without esophagitis: Secondary | ICD-10-CM | POA: Diagnosis present

## 2019-03-03 DIAGNOSIS — E890 Postprocedural hypothyroidism: Secondary | ICD-10-CM | POA: Diagnosis present

## 2019-03-03 DIAGNOSIS — Z79899 Other long term (current) drug therapy: Secondary | ICD-10-CM | POA: Diagnosis not present

## 2019-03-03 DIAGNOSIS — M1990 Unspecified osteoarthritis, unspecified site: Secondary | ICD-10-CM | POA: Diagnosis present

## 2019-03-03 DIAGNOSIS — G935 Compression of brain: Principal | ICD-10-CM | POA: Diagnosis present

## 2019-03-03 DIAGNOSIS — Z87442 Personal history of urinary calculi: Secondary | ICD-10-CM

## 2019-03-03 DIAGNOSIS — J45909 Unspecified asthma, uncomplicated: Secondary | ICD-10-CM | POA: Diagnosis present

## 2019-03-03 DIAGNOSIS — Z8673 Personal history of transient ischemic attack (TIA), and cerebral infarction without residual deficits: Secondary | ICD-10-CM | POA: Diagnosis not present

## 2019-03-03 DIAGNOSIS — Z91018 Allergy to other foods: Secondary | ICD-10-CM | POA: Diagnosis not present

## 2019-03-03 DIAGNOSIS — Z87892 Personal history of anaphylaxis: Secondary | ICD-10-CM

## 2019-03-03 DIAGNOSIS — Z91013 Allergy to seafood: Secondary | ICD-10-CM | POA: Diagnosis not present

## 2019-03-03 DIAGNOSIS — Z885 Allergy status to narcotic agent status: Secondary | ICD-10-CM | POA: Diagnosis not present

## 2019-03-03 DIAGNOSIS — F17210 Nicotine dependence, cigarettes, uncomplicated: Secondary | ICD-10-CM | POA: Diagnosis present

## 2019-03-03 DIAGNOSIS — F419 Anxiety disorder, unspecified: Secondary | ICD-10-CM | POA: Diagnosis present

## 2019-03-03 HISTORY — PX: SUBOCCIPITAL CRANIECTOMY CERVICAL LAMINECTOMY: SHX5404

## 2019-03-03 LAB — BASIC METABOLIC PANEL
Anion gap: 10 (ref 5–15)
BUN: 14 mg/dL (ref 6–20)
CO2: 20 mmol/L — ABNORMAL LOW (ref 22–32)
Calcium: 9.2 mg/dL (ref 8.9–10.3)
Chloride: 107 mmol/L (ref 98–111)
Creatinine, Ser: 0.96 mg/dL (ref 0.44–1.00)
GFR calc Af Amer: >60 mL/min (ref >60)
GFR calc non Af Amer: >60 mL/min (ref >60)
Glucose, Bld: 122 mg/dL — ABNORMAL HIGH (ref 70–99)
Potassium: 3.9 mmol/L (ref 3.5–5.1)
Sodium: 137 mmol/L (ref 135–145)

## 2019-03-03 LAB — CBC
HCT: 43.7 % (ref 36.0–46.0)
Hemoglobin: 14.3 g/dL (ref 12.0–15.0)
MCH: 31.8 pg (ref 26.0–34.0)
MCHC: 32.7 g/dL (ref 30.0–36.0)
MCV: 97.3 fL (ref 80.0–100.0)
Platelets: 240 10*3/uL (ref 150–400)
RBC: 4.49 MIL/uL (ref 3.87–5.11)
RDW: 13.2 % (ref 11.5–15.5)
WBC: 16.4 10*3/uL — ABNORMAL HIGH (ref 4.0–10.5)
nRBC: 0 % (ref 0.0–0.2)

## 2019-03-03 LAB — POCT PREGNANCY, URINE: Preg Test, Ur: NEGATIVE

## 2019-03-03 SURGERY — SUBOCCIPITAL CRANIECTOMY CERVICAL LAMINECTOMY/DURAPLASTY
Anesthesia: General | Site: Head

## 2019-03-03 MED ORDER — ACETAMINOPHEN 500 MG PO TABS
ORAL_TABLET | ORAL | Status: AC
  Administered 2019-03-03: 1000 mg via ORAL
  Filled 2019-03-03: qty 2

## 2019-03-03 MED ORDER — ROCURONIUM BROMIDE 10 MG/ML (PF) SYRINGE
PREFILLED_SYRINGE | INTRAVENOUS | Status: DC | PRN
  Administered 2019-03-03 (×2): 20 mg via INTRAVENOUS
  Administered 2019-03-03: 10 mg via INTRAVENOUS
  Administered 2019-03-03: 50 mg via INTRAVENOUS

## 2019-03-03 MED ORDER — BUPIVACAINE HCL (PF) 0.25 % IJ SOLN
INTRAMUSCULAR | Status: AC
  Filled 2019-03-03: qty 30

## 2019-03-03 MED ORDER — NICOTINE 14 MG/24HR TD PT24
14.0000 mg | MEDICATED_PATCH | Freq: Every day | TRANSDERMAL | Status: DC
  Administered 2019-03-03 – 2019-03-05 (×3): 14 mg via TRANSDERMAL
  Filled 2019-03-03 (×3): qty 1

## 2019-03-03 MED ORDER — DEXAMETHASONE SODIUM PHOSPHATE 10 MG/ML IJ SOLN
6.0000 mg | Freq: Four times a day (QID) | INTRAMUSCULAR | Status: AC
  Administered 2019-03-03 – 2019-03-04 (×4): 6 mg via INTRAVENOUS
  Filled 2019-03-03 (×4): qty 1

## 2019-03-03 MED ORDER — KETOROLAC TROMETHAMINE 0.5 % OP SOLN
1.0000 [drp] | Freq: Four times a day (QID) | OPHTHALMIC | Status: DC
  Administered 2019-03-03 – 2019-03-05 (×7): 1 [drp] via OPHTHALMIC
  Filled 2019-03-03: qty 5

## 2019-03-03 MED ORDER — DEXAMETHASONE SODIUM PHOSPHATE 10 MG/ML IJ SOLN
INTRAMUSCULAR | Status: AC
  Filled 2019-03-03: qty 1

## 2019-03-03 MED ORDER — DIAZEPAM 5 MG/ML IJ SOLN: 2.0000 mg | Freq: Once | INTRAMUSCULAR | Status: DC

## 2019-03-03 MED ORDER — ONDANSETRON HCL 4 MG/2ML IJ SOLN
INTRAMUSCULAR | Status: DC | PRN
  Administered 2019-03-03: 4 mg via INTRAVENOUS

## 2019-03-03 MED ORDER — LIDOCAINE 2% (20 MG/ML) 5 ML SYRINGE
INTRAMUSCULAR | Status: DC | PRN
  Administered 2019-03-03: 60 mg via INTRAVENOUS

## 2019-03-03 MED ORDER — DOCUSATE SODIUM 100 MG PO CAPS
100.0000 mg | ORAL_CAPSULE | Freq: Two times a day (BID) | ORAL | Status: DC
  Administered 2019-03-03 – 2019-03-05 (×4): 100 mg via ORAL
  Filled 2019-03-03 (×4): qty 1

## 2019-03-03 MED ORDER — ONDANSETRON HCL 4 MG/2ML IJ SOLN
INTRAMUSCULAR | Status: AC
  Filled 2019-03-03: qty 2

## 2019-03-03 MED ORDER — ACETAMINOPHEN 10 MG/ML IV SOLN
1000.0000 mg | Freq: Once | INTRAVENOUS | Status: DC | PRN
  Administered 2019-03-03: 11:00:00 1000 mg via INTRAVENOUS

## 2019-03-03 MED ORDER — THROMBIN 5000 UNITS EX SOLR
CUTANEOUS | Status: AC
  Filled 2019-03-03: qty 10000

## 2019-03-03 MED ORDER — METHOCARBAMOL 1000 MG/10ML IJ SOLN
1000.0000 mg | Freq: Four times a day (QID) | INTRAVENOUS | Status: DC | PRN
  Filled 2019-03-03: qty 10

## 2019-03-03 MED ORDER — PNEUMOCOCCAL VAC POLYVALENT 25 MCG/0.5ML IJ INJ: 0.5000 mL | INJECTION | INTRAMUSCULAR | Status: DC

## 2019-03-03 MED ORDER — SCOPOLAMINE 1 MG/3DAYS TD PT72
MEDICATED_PATCH | TRANSDERMAL | Status: AC
  Filled 2019-03-03: qty 1

## 2019-03-03 MED ORDER — CHLORHEXIDINE GLUCONATE CLOTH 2 % EX PADS: 6.0000 | MEDICATED_PAD | Freq: Once | CUTANEOUS | Status: DC

## 2019-03-03 MED ORDER — DIFLUPREDNATE 0.05 % OP EMUL: 1.0000 [drp] | Freq: Three times a day (TID) | OPHTHALMIC | Status: DC

## 2019-03-03 MED ORDER — METHOCARBAMOL 1000 MG/10ML IJ SOLN
500.0000 mg | Freq: Once | INTRAVENOUS | Status: AC
  Administered 2019-03-03: 500 mg via INTRAVENOUS
  Filled 2019-03-03 (×2): qty 5

## 2019-03-03 MED ORDER — ACETAMINOPHEN 10 MG/ML IV SOLN
INTRAVENOUS | Status: AC
  Filled 2019-03-03: qty 100

## 2019-03-03 MED ORDER — DEXAMETHASONE SODIUM PHOSPHATE 4 MG/ML IJ SOLN
4.0000 mg | Freq: Four times a day (QID) | INTRAMUSCULAR | Status: AC
  Administered 2019-03-04 – 2019-03-05 (×4): 4 mg via INTRAVENOUS
  Filled 2019-03-03 (×4): qty 1

## 2019-03-03 MED ORDER — THROMBIN 5000 UNITS EX SOLR
CUTANEOUS | Status: AC
  Filled 2019-03-03: qty 5000

## 2019-03-03 MED ORDER — MIDAZOLAM HCL 2 MG/2ML IJ SOLN
INTRAMUSCULAR | Status: AC
  Filled 2019-03-03: qty 2

## 2019-03-03 MED ORDER — MIDAZOLAM HCL 5 MG/5ML IJ SOLN
INTRAMUSCULAR | Status: DC | PRN
  Administered 2019-03-03: 2 mg via INTRAVENOUS

## 2019-03-03 MED ORDER — ACETAMINOPHEN 500 MG PO TABS
1000.0000 mg | ORAL_TABLET | Freq: Four times a day (QID) | ORAL | Status: DC | PRN
  Administered 2019-03-03 – 2019-03-04 (×4): 1000 mg via ORAL
  Filled 2019-03-03 (×5): qty 2

## 2019-03-03 MED ORDER — HEMOSTATIC AGENTS (NO CHARGE) OPTIME
TOPICAL | Status: DC | PRN
  Administered 2019-03-03: 1 via TOPICAL

## 2019-03-03 MED ORDER — LEVALBUTEROL TARTRATE 45 MCG/ACT IN AERO: 1.0000 | INHALATION_SPRAY | RESPIRATORY_TRACT | Status: DC | PRN

## 2019-03-03 MED ORDER — FENTANYL CITRATE (PF) 250 MCG/5ML IJ SOLN
INTRAMUSCULAR | Status: AC
  Filled 2019-03-03: qty 5

## 2019-03-03 MED ORDER — SCOPOLAMINE 1 MG/3DAYS TD PT72
MEDICATED_PATCH | TRANSDERMAL | Status: DC | PRN
  Administered 2019-03-03: 1 via TRANSDERMAL

## 2019-03-03 MED ORDER — ONDANSETRON HCL 4 MG PO TABS: 4.0000 mg | ORAL_TABLET | ORAL | Status: DC | PRN

## 2019-03-03 MED ORDER — CALCIUM CARBONATE ANTACID 500 MG PO CHEW
2.0000 | CHEWABLE_TABLET | Freq: Two times a day (BID) | ORAL | Status: DC | PRN
  Administered 2019-03-04 – 2019-03-05 (×2): 400 mg via ORAL
  Filled 2019-03-03 (×2): qty 2

## 2019-03-03 MED ORDER — OXYCODONE HCL 10 MG PO TABS: 10.0000 mg | ORAL_TABLET | Freq: Four times a day (QID) | ORAL | Status: DC | PRN

## 2019-03-03 MED ORDER — ONDANSETRON HCL 4 MG/2ML IJ SOLN
4.0000 mg | INTRAMUSCULAR | Status: DC | PRN
  Administered 2019-03-03: 4 mg via INTRAVENOUS
  Filled 2019-03-03: qty 2

## 2019-03-03 MED ORDER — DEXAMETHASONE SODIUM PHOSPHATE 4 MG/ML IJ SOLN: 4.0000 mg | Freq: Three times a day (TID) | INTRAMUSCULAR | Status: DC

## 2019-03-03 MED ORDER — METHOCARBAMOL 1000 MG/10ML IJ SOLN
1000.0000 mg | Freq: Four times a day (QID) | INTRAVENOUS | Status: DC | PRN
  Administered 2019-03-04 – 2019-03-05 (×4): 1000 mg via INTRAVENOUS
  Filled 2019-03-03 (×6): qty 10

## 2019-03-03 MED ORDER — DEXAMETHASONE SODIUM PHOSPHATE 10 MG/ML IJ SOLN
10.0000 mg | Freq: Once | INTRAMUSCULAR | Status: AC
  Administered 2019-03-03: 10 mg via INTRAVENOUS

## 2019-03-03 MED ORDER — LIDOCAINE 2% (20 MG/ML) 5 ML SYRINGE
INTRAMUSCULAR | Status: AC
  Filled 2019-03-03: qty 5

## 2019-03-03 MED ORDER — METHOCARBAMOL 500 MG PO TABS
500.0000 mg | ORAL_TABLET | Freq: Four times a day (QID) | ORAL | Status: DC | PRN
  Administered 2019-03-03: 500 mg via ORAL
  Filled 2019-03-03: qty 1

## 2019-03-03 MED ORDER — SUCCINYLCHOLINE CHLORIDE 200 MG/10ML IV SOSY
PREFILLED_SYRINGE | INTRAVENOUS | Status: AC
  Filled 2019-03-03: qty 10

## 2019-03-03 MED ORDER — ACETAMINOPHEN 500 MG PO TABS
1000.0000 mg | ORAL_TABLET | Freq: Once | ORAL | Status: AC
  Administered 2019-03-03: 06:00:00 1000 mg via ORAL

## 2019-03-03 MED ORDER — LIDOCAINE-EPINEPHRINE 1 %-1:100000 IJ SOLN
INTRAMUSCULAR | Status: DC | PRN
  Administered 2019-03-03: 10 mL

## 2019-03-03 MED ORDER — CEFAZOLIN SODIUM-DEXTROSE 2-4 GM/100ML-% IV SOLN
2.0000 g | Freq: Three times a day (TID) | INTRAVENOUS | Status: AC
  Administered 2019-03-03 (×2): 2 g via INTRAVENOUS
  Filled 2019-03-03 (×2): qty 100

## 2019-03-03 MED ORDER — LABETALOL HCL 5 MG/ML IV SOLN: 10.0000 mg | INTRAVENOUS | Status: DC | PRN

## 2019-03-03 MED ORDER — SUGAMMADEX SODIUM 200 MG/2ML IV SOLN
INTRAVENOUS | Status: DC | PRN
  Administered 2019-03-03: 200 mg via INTRAVENOUS

## 2019-03-03 MED ORDER — PROMETHAZINE HCL 25 MG/ML IJ SOLN: 6.2500 mg | INTRAMUSCULAR | Status: DC | PRN

## 2019-03-03 MED ORDER — PANTOPRAZOLE SODIUM 40 MG IV SOLR
40.0000 mg | Freq: Every day | INTRAVENOUS | Status: DC
  Administered 2019-03-03 – 2019-03-04 (×2): 40 mg via INTRAVENOUS
  Filled 2019-03-03 (×2): qty 40

## 2019-03-03 MED ORDER — POTASSIUM CHLORIDE IN NACL 20-0.9 MEQ/L-% IV SOLN
INTRAVENOUS | Status: DC
  Administered 2019-03-03: 12:00:00 via INTRAVENOUS
  Filled 2019-03-03: qty 1000

## 2019-03-03 MED ORDER — HYDROMORPHONE HCL 1 MG/ML IJ SOLN
0.2500 mg | INTRAMUSCULAR | Status: DC | PRN
  Administered 2019-03-03 (×4): 0.5 mg via INTRAVENOUS

## 2019-03-03 MED ORDER — PROPOFOL 10 MG/ML IV BOLUS
INTRAVENOUS | Status: DC | PRN
  Administered 2019-03-03: 120 mg via INTRAVENOUS
  Administered 2019-03-03: 30 mg via INTRAVENOUS

## 2019-03-03 MED ORDER — CEFAZOLIN SODIUM-DEXTROSE 2-4 GM/100ML-% IV SOLN
INTRAVENOUS | Status: AC
  Filled 2019-03-03: qty 100

## 2019-03-03 MED ORDER — ALBUTEROL SULFATE (2.5 MG/3ML) 0.083% IN NEBU: 2.5000 mg | INHALATION_SOLUTION | RESPIRATORY_TRACT | Status: DC | PRN

## 2019-03-03 MED ORDER — HYDROMORPHONE HCL 1 MG/ML IJ SOLN
1.0000 mg | Freq: Once | INTRAMUSCULAR | Status: AC
  Administered 2019-03-03: 1 mg via INTRAVENOUS
  Filled 2019-03-03: qty 1

## 2019-03-03 MED ORDER — THROMBIN 5000 UNITS EX SOLR
CUTANEOUS | Status: DC | PRN
  Administered 2019-03-03 (×2): 5000 [IU] via TOPICAL

## 2019-03-03 MED ORDER — HYDROCODONE-ACETAMINOPHEN 5-325 MG PO TABS: 1.0000 | ORAL_TABLET | ORAL | Status: DC | PRN

## 2019-03-03 MED ORDER — FENTANYL CITRATE (PF) 100 MCG/2ML IJ SOLN
INTRAMUSCULAR | Status: DC | PRN
  Administered 2019-03-03 (×3): 50 ug via INTRAVENOUS
  Administered 2019-03-03 (×2): 100 ug via INTRAVENOUS
  Administered 2019-03-03: 50 ug via INTRAVENOUS
  Administered 2019-03-03: 100 ug via INTRAVENOUS

## 2019-03-03 MED ORDER — GATIFLOXACIN 0.5 % OP SOLN
1.0000 [drp] | Freq: Four times a day (QID) | OPHTHALMIC | Status: DC
  Administered 2019-03-03 – 2019-03-05 (×8): 1 [drp] via OPHTHALMIC
  Filled 2019-03-03: qty 2.5

## 2019-03-03 MED ORDER — CHLORHEXIDINE GLUCONATE CLOTH 2 % EX PADS: 6.0000 | MEDICATED_PAD | Freq: Every day | CUTANEOUS | Status: DC

## 2019-03-03 MED ORDER — PROPOFOL 10 MG/ML IV BOLUS
INTRAVENOUS | Status: AC
  Filled 2019-03-03: qty 20

## 2019-03-03 MED ORDER — SODIUM CHLORIDE 0.9 % IV SOLN
INTRAVENOUS | Status: DC | PRN
  Administered 2019-03-03: 500 mL

## 2019-03-03 MED ORDER — CEFAZOLIN SODIUM-DEXTROSE 2-4 GM/100ML-% IV SOLN
2.0000 g | INTRAVENOUS | Status: AC
  Administered 2019-03-03: 08:00:00 2 g via INTRAVENOUS

## 2019-03-03 MED ORDER — BACITRACIN ZINC 500 UNIT/GM EX OINT
TOPICAL_OINTMENT | CUTANEOUS | Status: DC | PRN
  Administered 2019-03-03: 1 via TOPICAL

## 2019-03-03 MED ORDER — OXYCODONE HCL 5 MG PO TABS
15.0000 mg | ORAL_TABLET | ORAL | Status: DC | PRN
  Administered 2019-03-03 – 2019-03-05 (×10): 15 mg via ORAL
  Filled 2019-03-03 (×10): qty 3

## 2019-03-03 MED ORDER — ROCURONIUM BROMIDE 10 MG/ML (PF) SYRINGE
PREFILLED_SYRINGE | INTRAVENOUS | Status: AC
  Filled 2019-03-03: qty 10

## 2019-03-03 MED ORDER — GABAPENTIN 300 MG PO CAPS
300.0000 mg | ORAL_CAPSULE | Freq: Three times a day (TID) | ORAL | Status: DC | PRN
  Administered 2019-03-03 – 2019-03-05 (×6): 300 mg via ORAL
  Filled 2019-03-03 (×6): qty 1

## 2019-03-03 MED ORDER — HYDROMORPHONE HCL 1 MG/ML IJ SOLN
0.5000 mg | INTRAMUSCULAR | Status: DC | PRN
  Administered 2019-03-03: 1 mg via INTRAVENOUS
  Filled 2019-03-03: qty 1

## 2019-03-03 MED ORDER — PROMETHAZINE HCL 25 MG PO TABS: 12.5000 mg | ORAL_TABLET | ORAL | Status: DC | PRN

## 2019-03-03 MED ORDER — LIDOCAINE-EPINEPHRINE 1 %-1:100000 IJ SOLN
INTRAMUSCULAR | Status: AC
  Filled 2019-03-03: qty 1

## 2019-03-03 MED ORDER — BACITRACIN ZINC 500 UNIT/GM EX OINT
TOPICAL_OINTMENT | CUTANEOUS | Status: AC
  Filled 2019-03-03: qty 28.35

## 2019-03-03 MED ORDER — HYDROMORPHONE HCL 1 MG/ML IJ SOLN
INTRAMUSCULAR | Status: AC
  Filled 2019-03-03: qty 2

## 2019-03-03 MED ORDER — MEPERIDINE HCL 25 MG/ML IJ SOLN: 6.2500 mg | INTRAMUSCULAR | Status: DC | PRN

## 2019-03-03 MED ORDER — DIAZEPAM 5 MG PO TABS
5.0000 mg | ORAL_TABLET | Freq: Two times a day (BID) | ORAL | Status: DC | PRN
  Administered 2019-03-03: 22:00:00 5 mg via ORAL
  Filled 2019-03-03 (×2): qty 1

## 2019-03-03 MED ORDER — SUCCINYLCHOLINE CHLORIDE 200 MG/10ML IV SOSY
PREFILLED_SYRINGE | INTRAVENOUS | Status: DC | PRN
  Administered 2019-03-03: 100 mg via INTRAVENOUS

## 2019-03-03 MED ORDER — HYDROCODONE-ACETAMINOPHEN 7.5-325 MG PO TABS: 1.0000 | ORAL_TABLET | Freq: Once | ORAL | Status: DC | PRN

## 2019-03-03 MED ORDER — LACTATED RINGERS IV SOLN
INTRAVENOUS | Status: DC | PRN
  Administered 2019-03-03: 07:00:00 via INTRAVENOUS

## 2019-03-03 SURGICAL SUPPLY — 75 items
BAG DECANTER FOR FLEXI CONT (MISCELLANEOUS) ×3 IMPLANT
BENZOIN TINCTURE PRP APPL 2/3 (GAUZE/BANDAGES/DRESSINGS) ×3 IMPLANT
BLADE CLIPPER SURG (BLADE) ×3 IMPLANT
BLADE SURG 11 STRL SS (BLADE) ×3 IMPLANT
BLADE ULTRA TIP 2M (BLADE) IMPLANT
BUR ACORN 9.0 PRECISION (BURR) ×2 IMPLANT
BUR ACORN 9.0MM PRECISION (BURR) ×1
CABLE BIPOLOR RESECTION CORD (MISCELLANEOUS) IMPLANT
CANISTER SUCT 3000ML PPV (MISCELLANEOUS) ×3 IMPLANT
CARTRIDGE OIL MAESTRO DRILL (MISCELLANEOUS) ×1 IMPLANT
CLIP VESOCCLUDE MED 6/CT (CLIP) ×3 IMPLANT
CLOSURE WOUND 1/2 X4 (GAUZE/BANDAGES/DRESSINGS) ×1
COVER WAND RF STERILE (DRAPES) IMPLANT
DERMABOND ADVANCED (GAUZE/BANDAGES/DRESSINGS)
DERMABOND ADVANCED .7 DNX12 (GAUZE/BANDAGES/DRESSINGS) IMPLANT
DIFFUSER DRILL AIR PNEUMATIC (MISCELLANEOUS) ×3 IMPLANT
DRAPE LAPAROTOMY 100X72 PEDS (DRAPES) ×3 IMPLANT
DRAPE MICROSCOPE LEICA (MISCELLANEOUS) IMPLANT
DRAPE SURG 17X23 STRL (DRAPES) ×6 IMPLANT
DRAPE WARM FLUID 44X44 (DRAPES) IMPLANT
DRSG OPSITE POSTOP 4X6 (GAUZE/BANDAGES/DRESSINGS) ×3 IMPLANT
DURAGUARD 04CMX04CM ×3 IMPLANT
ELECT BLADE 4.0 EZ CLEAN MEGAD (MISCELLANEOUS) ×3
ELECT REM PT RETURN 9FT ADLT (ELECTROSURGICAL) ×3
ELECTRODE BLDE 4.0 EZ CLN MEGD (MISCELLANEOUS) ×1 IMPLANT
ELECTRODE REM PT RTRN 9FT ADLT (ELECTROSURGICAL) ×1 IMPLANT
EVACUATOR 1/8 PVC DRAIN (DRAIN) IMPLANT
EVACUATOR SILICONE 100CC (DRAIN) IMPLANT
GAUZE 4X4 16PLY RFD (DISPOSABLE) IMPLANT
GAUZE SPONGE 4X4 12PLY STRL (GAUZE/BANDAGES/DRESSINGS) IMPLANT
GLOVE BIO SURGEON STRL SZ7 (GLOVE) ×3 IMPLANT
GLOVE BIO SURGEON STRL SZ7.5 (GLOVE) IMPLANT
GLOVE BIO SURGEON STRL SZ8 (GLOVE) ×3 IMPLANT
GLOVE BIOGEL PI IND STRL 7.0 (GLOVE) ×2 IMPLANT
GLOVE BIOGEL PI IND STRL 8 (GLOVE) ×2 IMPLANT
GLOVE BIOGEL PI INDICATOR 7.0 (GLOVE) ×4
GLOVE BIOGEL PI INDICATOR 8 (GLOVE) ×4
GLOVE ECLIPSE 7.5 STRL STRAW (GLOVE) ×9 IMPLANT
GLOVE EXAM NITRILE XL STR (GLOVE) IMPLANT
GLOVE INDICATOR 8.5 STRL (GLOVE) ×3 IMPLANT
GOWN STRL REUS W/ TWL LRG LVL3 (GOWN DISPOSABLE) ×3 IMPLANT
GOWN STRL REUS W/ TWL XL LVL3 (GOWN DISPOSABLE) ×1 IMPLANT
GOWN STRL REUS W/TWL 2XL LVL3 (GOWN DISPOSABLE) IMPLANT
GOWN STRL REUS W/TWL LRG LVL3 (GOWN DISPOSABLE) ×6
GOWN STRL REUS W/TWL XL LVL3 (GOWN DISPOSABLE) ×2
HEMOSTAT SURGICEL 2X14 (HEMOSTASIS) IMPLANT
KIT BASIN OR (CUSTOM PROCEDURE TRAY) ×3 IMPLANT
KIT TURNOVER KIT B (KITS) ×3 IMPLANT
NEEDLE HYPO 22GX1.5 SAFETY (NEEDLE) ×3 IMPLANT
NS IRRIG 1000ML POUR BTL (IV SOLUTION) ×3 IMPLANT
OIL CARTRIDGE MAESTRO DRILL (MISCELLANEOUS) ×3
PACK LAMINECTOMY NEURO (CUSTOM PROCEDURE TRAY) ×3 IMPLANT
PAD ARMBOARD 7.5X6 YLW CONV (MISCELLANEOUS) ×12 IMPLANT
PATTIES SURGICAL .5 X.5 (GAUZE/BANDAGES/DRESSINGS) ×3 IMPLANT
PATTIES SURGICAL 1/4 X 3 (GAUZE/BANDAGES/DRESSINGS) IMPLANT
RUBBERBAND STERILE (MISCELLANEOUS) IMPLANT
SEALANT ADHERUS EXTEND TIP (MISCELLANEOUS) ×3 IMPLANT
SPONGE LAP 4X18 RFD (DISPOSABLE) IMPLANT
SPONGE SURGIFOAM ABS GEL SZ50 (HEMOSTASIS) ×3 IMPLANT
STAPLER SKIN PROX WIDE 3.9 (STAPLE) IMPLANT
STRIP CLOSURE SKIN 1/2X4 (GAUZE/BANDAGES/DRESSINGS) ×2 IMPLANT
SUT ETHILON 2 0 FS 18 (SUTURE) IMPLANT
SUT ETHILON 3 0 FSL (SUTURE) ×3 IMPLANT
SUT NURALON 4 0 TR CR/8 (SUTURE) ×6 IMPLANT
SUT PROLENE 6 0 BV (SUTURE) IMPLANT
SUT VIC AB 1 CT1 18XBRD ANBCTR (SUTURE) ×1 IMPLANT
SUT VIC AB 1 CT1 8-18 (SUTURE) ×2
SUT VIC AB 2-0 CT1 18 (SUTURE) ×6 IMPLANT
SUT VIC AB 3-0 SH 8-18 (SUTURE) IMPLANT
SUT VIC AB 4-0 PS2 18 (SUTURE) ×3 IMPLANT
TOWEL GREEN STERILE (TOWEL DISPOSABLE) ×3 IMPLANT
TOWEL GREEN STERILE FF (TOWEL DISPOSABLE) ×3 IMPLANT
TRAY FOLEY MTR SLVR 16FR STAT (SET/KITS/TRAYS/PACK) ×3 IMPLANT
UNDERPAD 30X30 (UNDERPADS AND DIAPERS) IMPLANT
WATER STERILE IRR 1000ML POUR (IV SOLUTION) ×3 IMPLANT

## 2019-03-03 NOTE — Anesthesia Procedure Notes (Signed)
Procedure Name: Intubation Date/Time: 03/03/2019 7:33 AM Performed by: Barnet Glasgow, MD Pre-anesthesia Checklist: Patient identified, Emergency Drugs available, Suction available and Patient being monitored Patient Re-evaluated:Patient Re-evaluated prior to induction Oxygen Delivery Method: Circle system utilized Preoxygenation: Pre-oxygenation with 100% oxygen Induction Type: IV induction Ventilation: Mask ventilation without difficulty Laryngoscope Size: Mac and 3 (glidescope 3) Grade View: Grade I Tube type: Oral Tube size: 7.5 mm Number of attempts: 1 Airway Equipment and Method: Stylet Placement Confirmation: ETT inserted through vocal cords under direct vision,  positive ETCO2 and breath sounds checked- equal and bilateral Secured at: 23 cm Tube secured with: Tape Dental Injury: Teeth and Oropharynx as per pre-operative assessment

## 2019-03-03 NOTE — Plan of Care (Signed)
  Problem: Clinical Measurements: Goal: Respiratory complications will improve Outcome: Progressing Goal: Cardiovascular complication will be avoided Outcome: Progressing   Problem: Skin Integrity: Goal: Risk for impaired skin integrity will decrease Outcome: Progressing   Problem: Pain Managment: Goal: General experience of comfort will improve Outcome: Not Progressing

## 2019-03-03 NOTE — Anesthesia Postprocedure Evaluation (Signed)
Anesthesia Post Note  Patient: Rebecca Reilly  Procedure(s) Performed: Chiari Decompression (N/A Head)     Patient location during evaluation: PACU Anesthesia Type: General Level of consciousness: awake and alert Pain management: pain level controlled Vital Signs Assessment: post-procedure vital signs reviewed and stable Respiratory status: spontaneous breathing, nonlabored ventilation, respiratory function stable and patient connected to nasal cannula oxygen Cardiovascular status: blood pressure returned to baseline and stable Postop Assessment: no apparent nausea or vomiting Anesthetic complications: no    Last Vitals:  Vitals:   03/03/19 1300 03/03/19 1315  BP: (!) 143/119 111/76  Pulse: 98 87  Resp: 15 13  Temp:    SpO2: 98% 96%    Last Pain:  Vitals:   03/03/19 1145  TempSrc: Oral  PainSc:                  Barnet Glasgow

## 2019-03-03 NOTE — Transfer of Care (Signed)
Immediate Anesthesia Transfer of Care Note  Patient: Rebecca Reilly  Procedure(s) Performed: Chiari Decompression (N/A Head)  Patient Location: PACU  Anesthesia Type:General  Level of Consciousness: alert  and drowsy  Airway & Oxygen Therapy: Patient Spontanous Breathing and Patient connected to nasal cannula oxygen  Post-op Assessment: Report given to RN, Post -op Vital signs reviewed and stable and Patient moving all extremities X 4  Post vital signs: Reviewed and stable  Last Vitals:  Vitals Value Taken Time  BP 131/78 03/03/19 1039  Temp    Pulse 87 03/03/19 1045  Resp 14 03/03/19 1045  SpO2 100 % 03/03/19 1045  Vitals shown include unvalidated device data.  Last Pain:  Vitals:   03/03/19 0620  TempSrc: Oral  PainSc:          Complications: No apparent anesthesia complications

## 2019-03-03 NOTE — H&P (Signed)
Rebecca Reilly is an 38 y.o. female.   Chief Complaint: Headaches HPI: 38 year old female with longstanding chronic headaches refractory to medical management.  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  Significant crowding at the foramen magnum and compression of the lower brainstem and upper spinal cord.  Due to patient's progression of clinical syndrome imaging findings and failed conservative treatment I recommended suboccipital craniectomy and partial C1 laminectomy for decompression of Chiari malformation.  I have extensively gone over the risks and benefits of that operation with her as well as perioperative course expectations of outcome and alternatives of surgery and she understands and agrees to proceed forward.  Past Medical History:  Diagnosis Date  . Anxiety   . Arthritis   . Asthma   . Back pain, chronic    bulging disc  . Dyspnea   . Family history of adverse reaction to anesthesia    daughter: has hx seizures, versed increased seizure activity  . GERD (gastroesophageal reflux disease)   . H/O partial thyroidectomy 2010   right thyroidectomy  . History of kidney stones   . Hypotension   . Hypothyroidism   . Migraines    chronic  . Neck pain, chronic    buldging disc  . PONV (postoperative nausea and vomiting)   . TIA (transient ischemic attack)    Right sided weakness    Past Surgical History:  Procedure Laterality Date  . EYE SURGERY Left    cataract removal with lens placement  . KNEE SURGERY Right   . TUBAL LIGATION      History reviewed. No pertinent family history. Social History:  reports that she has been smoking cigarettes. She has been smoking about 1.00 pack per day. She has never used smokeless tobacco. She reports previous alcohol use. She reports previous drug use.  Allergies:  Allergies  Allergen Reactions  . Almond Oil Anaphylaxis  . Fish Allergy Anaphylaxis  . Morphine Itching    Medications Prior  to Admission  Medication Sig Dispense Refill  . acetaminophen (TYLENOL) 500 MG tablet Take 1,000 mg by mouth every 6 (six) hours as needed for mild pain.     . calcium carbonate (TUMS - DOSED IN MG ELEMENTAL CALCIUM) 500 MG chewable tablet Chew 2 tablets by mouth 2 (two) times daily as needed for indigestion or heartburn.    . DUREZOL 0.05 % EMUL Place 1 drop into the left eye 3 (three) times daily.    Marland Kitchen gabapentin (NEURONTIN) 300 MG capsule Take 300 mg by mouth 3 (three) times daily as needed (pain).     Marland Kitchen ketorolac (ACULAR) 0.5 % ophthalmic solution Place 1 drop into the left eye 4 (four) times daily.    Marland Kitchen levalbuterol (XOPENEX HFA) 45 MCG/ACT inhaler Inhale 1 puff into the lungs every 4 (four) hours as needed for wheezing.    . Oxycodone HCl 10 MG TABS Take 10 mg by mouth every 6 (six) hours as needed (for pain).     Marland Kitchen VIGAMOX 0.5 % ophthalmic solution Place 1 drop into the left eye 4 (four) times daily.      Results for orders placed or performed during the hospital encounter of 03/03/19 (from the past 48 hour(s))  Pregnancy, urine POC     Status: None   Collection Time: 03/03/19  6:27 AM  Result Value Ref Range   Preg Test, Ur NEGATIVE NEGATIVE    Comment:        THE SENSITIVITY OF THIS METHODOLOGY  IS >24 mIU/mL    No results found.  Review of Systems  Neurological: Positive for headaches.    Blood pressure (!) 103/50, pulse 78, temperature 98 F (36.7 C), temperature source Oral, resp. rate 20, last menstrual period 01/31/2019, SpO2 98 %. Physical Exam  Constitutional: She is oriented to person, place, and time. She appears well-developed.  HENT:  Head: Normocephalic.  Eyes: Pupils are equal, round, and reactive to light.  Neck: Normal range of motion.  Cardiovascular: Normal rate.  Respiratory: Effort normal.  GI: Soft.  Musculoskeletal: Normal range of motion.  Neurological: She is alert and oriented to person, place, and time. She has normal strength. GCS eye subscore  is 4. GCS verbal subscore is 5. GCS motor subscore is 6.  Patient is awake and alert cranial nerves are intact strength is 5 out of 5 upper and lower extremities  Skin: Skin is warm and dry.     Assessment/Plan 38 year old female presents for suboccipital craniectomy for Chiari decompression  Raidyn Wassink P, MD 03/03/2019, 7:22 AM

## 2019-03-03 NOTE — Op Note (Signed)
Preoperative diagnosis: Chiari I malformation with tonsillar distention down to below C1  Postoperative diagnosis: Same  Procedure: Suboccipital craniectomy and C1 laminectomy for decompression of Chiari I malformation  Surgeon: Dominica Severin Dandra Shambaugh  Assistant: Nash Shearer  Anesthesia: General  EBL: Minimal  HPI: 38 year old female with longstanding chronic headaches work-up revealed Chiari I malformation with tonsillar distention and a tight foramen magnum.  Due to patient failed conservative treatment imaging findings and progression of clinical syndrome I recommended Chiari malformation decompression.  I extensively went over the risks and benefits of a suboccipital craniectomy and C1 laminectomy for decompression of Chiari.  I extensively went over the risks and benefits of the operation with her as well as perioperative course expectations of outcome and alternatives of surgery and she understood and agreed to proceed forward.  Operative procedure: Patient brought into the OR was used in general anesthesia positioned prone in pins neck in slight flexion the backside of her head neck was prepped and draped in routine sterile fashion.  A midline incision was made after infiltration of 10 cc lidocaine with epinephrine just off and below the inion to just below the C2 spinous process.  Subperiosteal disc with dissection was carried out along the occiput exposing the C1 lamina to foramen magnum and C2.  Then utilizing high-speed drill the craniectomy was performed over both cerebellar hemispheres down and opening up the foramen magnum I then drilled down the C1 lamina and performed a C1 laminectomy.  At this point I opened up the dura in a Y-shaped fashion over the cerebellar hemispheres converging and extending down to just above C2.  I did open up the arachnoid widely visualize the cerebellar tonsils which did look like they had been under compression.  I separated the tonsils to visualize the obex and  make sure was patent I did directly visualize the obex the fourth ventricle and the choroid plexus.  I freed up some arachnoid adhesions that were tenting and attached to the dura in order to free up the cerebellar tonsils.  At the end of decompression the cerebellum tonsils and brainstem cervical spinal cord was pulsating and was not under pressure anymore.  I then selected a 4 x 4 square Dura-Guard patch sewed it with a running nylon.  Then in sprayed green fibrin glue over the suture line place Gelfoam and the suture line and sprayed some more glue.  Meticulous hemostasis was maintained and I closed the wound in layers with interrupted Vicryl in a running nylon.  At the end the case all needle counts sponge counts were correct patient recovery in stable condition.

## 2019-03-04 LAB — BASIC METABOLIC PANEL
Anion gap: 11 (ref 5–15)
BUN: 8 mg/dL (ref 6–20)
CO2: 19 mmol/L — ABNORMAL LOW (ref 22–32)
Calcium: 8.9 mg/dL (ref 8.9–10.3)
Chloride: 108 mmol/L (ref 98–111)
Creatinine, Ser: 0.92 mg/dL (ref 0.44–1.00)
GFR calc Af Amer: >60 mL/min (ref >60)
GFR calc non Af Amer: >60 mL/min (ref >60)
Glucose, Bld: 162 mg/dL — ABNORMAL HIGH (ref 70–99)
Potassium: 4 mmol/L (ref 3.5–5.1)
Sodium: 138 mmol/L (ref 135–145)

## 2019-03-04 MED ORDER — HYDROMORPHONE HCL 1 MG/ML IJ SOLN
0.5000 mg | INTRAMUSCULAR | Status: DC | PRN
  Administered 2019-03-04 – 2019-03-05 (×11): 0.5 mg via INTRAVENOUS
  Filled 2019-03-04: qty 1
  Filled 2019-03-04: qty 0.5
  Filled 2019-03-04 (×3): qty 1
  Filled 2019-03-04: qty 0.5
  Filled 2019-03-04 (×2): qty 1
  Filled 2019-03-04: qty 0.5
  Filled 2019-03-04: qty 1
  Filled 2019-03-04: qty 0.5

## 2019-03-04 NOTE — Progress Notes (Signed)
Subjective: The patient is alert and pleasant.  She is in no apparent distress.  She does not feel her pain is adequately controlled.  Objective: Vital signs in last 24 hours: Temp:  [97.6 F (36.4 C)-98.7 F (37.1 C)] 98.4 F (36.9 C) (11/14 0800) Pulse Rate:  [65-118] 83 (11/14 0700) Resp:  [8-33] 13 (11/14 0700) BP: (100-143)/(50-119) 126/64 (11/14 0700) SpO2:  [94 %-100 %] 95 % (11/14 0700) Estimated body mass index is 31.73 kg/m as calculated from the following:   Height as of 03/01/19: 5\' 7"  (1.702 m).   Weight as of 03/01/19: 91.9 kg.   Intake/Output from previous day: 11/13 0701 - 11/14 0700 In: 2924.9 [I.V.:2575.1; IV Piggyback:349.8] Out: 0017 [Urine:3750; Blood:100] Intake/Output this shift: No intake/output data recorded.  Physical exam patient is alert and oriented.  Her speech is normal.  Her strength is normal.  Her wound is healing well without signs of CSF leak.  Lab Results: Recent Labs    03/01/19 1032 03/03/19 1207  WBC 10.8* 16.4*  HGB 15.4* 14.3  HCT 46.9* 43.7  PLT 241 240   BMET Recent Labs    03/03/19 1207 03/04/19 0412  NA 137 138  K 3.9 4.0  CL 107 108  CO2 20* 19*  GLUCOSE 122* 162*  BUN 14 8  CREATININE 0.96 0.92  CALCIUM 9.2 8.9    Studies/Results: No results found.  Assessment/Plan: Postop day #1: Patient is doing well.  I will transfer her to the progressive unit.  She may go home tomorrow.  I have answered all her questions.  LOS: 1 day     Ophelia Charter 03/04/2019, 8:41 AM

## 2019-03-05 ENCOUNTER — Encounter (HOSPITAL_COMMUNITY): Payer: Self-pay | Admitting: *Deleted

## 2019-03-05 MED ORDER — DEXAMETHASONE 6 MG PO TABS: 6.0000 mg | ORAL_TABLET | Freq: Three times a day (TID) | ORAL | 0 refills | Status: AC

## 2019-03-05 MED ORDER — DOCUSATE SODIUM 100 MG PO CAPS: 100.0000 mg | ORAL_CAPSULE | Freq: Two times a day (BID) | ORAL | 0 refills | Status: AC

## 2019-03-05 MED ORDER — METHOCARBAMOL 500 MG PO TABS: 500.0000 mg | ORAL_TABLET | Freq: Four times a day (QID) | ORAL | 0 refills | Status: DC | PRN

## 2019-03-05 MED ORDER — OXYCODONE HCL 15 MG PO TABS: 15.0000 mg | ORAL_TABLET | ORAL | 0 refills | Status: DC | PRN

## 2019-03-05 NOTE — Care Management (Signed)
Ordered RW to be delivered to room for discharge.

## 2019-03-05 NOTE — Progress Notes (Signed)
AVS was reviewed with patient and patient given a copy to take home. All lines removed, and family helped to dress patient. Patient was upset because rolling walker was needed for discharge and it was taking almost 2hrs  to her room. Nurse explained that case management already put in an order and that there was one supply man working in between hospitals so wait times can be a little longer than expected. Patient was not understanding and asked to speak to charge nurse. Charge nurse reiterated what was previously explained to the patient and her family. Patient was offered help to be wheeled down to her husband's car but patient and family refused. Patient was taken via wheelchair by husband to family car for discharge. Patient was asked to leave her phone number so that when walker arrived the nursing station would give her a call when it was ready.

## 2019-03-05 NOTE — Discharge Summary (Signed)
Physician Discharge Summary  Patient ID: Carolyn Sylvia MRN: [id number] DOB/AGE: [birth year] 38 y.o.  Admit date: 03/03/2019 Discharge date: 03/05/2019  Admission Diagnoses: Chiari I malformation  Discharge Diagnoses:  Active Problems:   Chiari I malformation [facility])   Discharged Condition: good  Hospital Course: Patient was admitted on 03/03/2019 following a suboccipital craniectomy and C1 laminectomy for decompression of Chiari I malformation. She has done well post operatively. She has ambulated. She is ready for discharge home.  Consults: None   Treatments: surgery:   Discharge Exam: Blood pressure 119/65, pulse 71, temperature 99.1 F (37.3 C), temperature source Oral, resp. rate 16, height 5\' 7"  (1.702 m), weight 95 kg, last menstrual period 01/31/2019, SpO2 97 %.  Alert and oriented x 4 PERRLA CN II-XII grossly intact MAE, Strength and sensation intact Incision is covered with Honeycomb dressing and Steri Strips; Dressing is dry and intact, with scant amount of dried sanguinous drainage   Disposition: Home   Allergies as of 03/05/2019      Reactions   Almond Oil Anaphylaxis   Fish Allergy Anaphylaxis   Morphine Itching      Medication List    TAKE these medications   acetaminophen 500 MG tablet Commonly known as: TYLENOL Take 1,000 mg by mouth every 6 (six) hours as needed for mild pain.   calcium carbonate 500 MG chewable tablet Commonly known as: TUMS - dosed in mg elemental calcium Chew 2 tablets by mouth 2 (two) times daily as needed for indigestion or heartburn.   dexamethasone 6 MG tablet Commonly known as: DECADRON Take 1 tablet (6 mg total) by mouth every 8 (eight) hours for 7 days.   docusate sodium 100 MG capsule Commonly known as: COLACE Take 1 capsule (100 mg total) by mouth 2 (two) times daily.   Durezol 0.05 % Emul Generic drug: Difluprednate Place 1 drop into the left eye 3 (three) times daily.   gabapentin 300 MG capsule Commonly  known as: NEURONTIN Take 300 mg by mouth 3 (three) times daily as needed (pain).   ketorolac 0.5 % ophthalmic solution Commonly known as: ACULAR Place 1 drop into the left eye 4 (four) times daily.   levalbuterol 45 MCG/ACT inhaler Commonly known as: XOPENEX HFA Inhale 1 puff into the lungs every 4 (four) hours as needed for wheezing.   methocarbamol 500 MG tablet Commonly known as: Robaxin Take 1 tablet (500 mg total) by mouth every 6 (six) hours as needed for muscle spasms.   oxyCODONE 15 MG immediate release tablet Commonly known as: ROXICODONE Take 1 tablet (15 mg total) by mouth every 4 (four) hours as needed for moderate pain or severe pain. What changed:   medication strength  how much to take  when to take this  reasons to take this   Vigamox 0.5 % ophthalmic solution Generic drug: moxifloxacin Place 1 drop into the left eye 4 (four) times daily.            Durable Medical Equipment  (From admission, onward)         Start     Ordered   03/05/19 0000  For home use only DME Walker    Question:  Patient needs a walker to treat with the following condition  Answer:  Gait instability   03/05/19 0954         Follow-up Information    03/07/19, MD. Schedule an appointment as soon as possible for a visit in 2 week(s).   Specialty: Neurosurgery Contact information:  [address] [place] [address] [place] [address] [phone]           Signed: Patricia Nettle 03/05/2019, 9:44 AM

## 2019-03-05 NOTE — Discharge Instructions (Signed)
Wound Care Keep incision covered and dry for two days.    Do not put any creams, lotions, or ointments on incision. Leave steri-strips on back.  They will fall off by themselves. Activity Walk each and every day, increasing distance each day. No lifting greater than 5 lbs.  Avoid excessive neck motion. No driving for 2 weeks; may ride as a passenger locally.  Diet Resume your normal diet.  Return to Work Will be discussed at your follow up appointment. Call Your Doctor If Any of These Occur Redness, drainage, or swelling at the wound.  Temperature greater than 101 degrees. Severe pain not relieved by pain medication. Incision starts to come apart. Follow Up Appt Call today for appointment in 2 weeks [phone]) or for problems.

## 2019-03-06 ENCOUNTER — Encounter (HOSPITAL_COMMUNITY): Payer: Self-pay | Admitting: Neurosurgery

## 2019-05-22 ENCOUNTER — Other Ambulatory Visit: Payer: Self-pay | Admitting: Student

## 2019-05-22 DIAGNOSIS — G935 Compression of brain: Secondary | ICD-10-CM

## 2019-06-08 ENCOUNTER — Ambulatory Visit
Admission: RE | Admit: 2019-06-08 | Discharge: 2019-06-08 | Disposition: A | Discharge status: Home / Self Care | Payer: Medicaid Other | Source: Ambulatory Visit | Attending: Student | Admitting: Student

## 2019-06-08 DIAGNOSIS — G935 Compression of brain: Secondary | ICD-10-CM

## 2019-06-08 MED ORDER — GADOBENATE DIMEGLUMINE 529 MG/ML IV SOLN
20.0000 mL | Freq: Once | INTRAVENOUS | Status: AC | PRN
  Administered 2019-06-08: 20 mL via INTRAVENOUS

## 2019-06-10 ENCOUNTER — Other Ambulatory Visit: Payer: Medicaid Other

## 2019-07-13 ENCOUNTER — Ambulatory Visit: Payer: Medicaid Other | Attending: Nurse Practitioner | Admitting: Rehabilitation

## 2019-07-13 ENCOUNTER — Other Ambulatory Visit: Payer: Self-pay

## 2019-07-13 ENCOUNTER — Encounter: Payer: Self-pay | Admitting: Rehabilitation

## 2019-07-13 DIAGNOSIS — R208 Other disturbances of skin sensation: Secondary | ICD-10-CM

## 2019-07-13 DIAGNOSIS — M21371 Foot drop, right foot: Secondary | ICD-10-CM | POA: Diagnosis present

## 2019-07-13 DIAGNOSIS — M6281 Muscle weakness (generalized): Secondary | ICD-10-CM | POA: Diagnosis present

## 2019-07-13 DIAGNOSIS — R209 Unspecified disturbances of skin sensation: Secondary | ICD-10-CM | POA: Insufficient documentation

## 2019-07-13 DIAGNOSIS — R2681 Unsteadiness on feet: Secondary | ICD-10-CM | POA: Insufficient documentation

## 2019-07-13 DIAGNOSIS — M542 Cervicalgia: Secondary | ICD-10-CM | POA: Diagnosis present

## 2019-07-13 DIAGNOSIS — R2689 Other abnormalities of gait and mobility: Secondary | ICD-10-CM | POA: Diagnosis present
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
## 2019-07-18 ENCOUNTER — Ambulatory Visit: Payer: Medicaid Other | Admitting: Diagnostic Neuroimaging

## 2019-07-24 ENCOUNTER — Ambulatory Visit: Payer: Medicaid Other | Attending: Nurse Practitioner | Admitting: Physical Therapy

## 2019-07-24 ENCOUNTER — Encounter: Payer: Self-pay | Admitting: Physical Therapy

## 2019-07-24 ENCOUNTER — Other Ambulatory Visit: Payer: Self-pay

## 2019-07-24 DIAGNOSIS — R209 Unspecified disturbances of skin sensation: Secondary | ICD-10-CM | POA: Diagnosis present

## 2019-07-24 DIAGNOSIS — M21371 Foot drop, right foot: Secondary | ICD-10-CM | POA: Diagnosis present

## 2019-07-24 DIAGNOSIS — M542 Cervicalgia: Secondary | ICD-10-CM | POA: Insufficient documentation

## 2019-07-24 DIAGNOSIS — M6281 Muscle weakness (generalized): Secondary | ICD-10-CM | POA: Diagnosis present

## 2019-07-24 DIAGNOSIS — R2689 Other abnormalities of gait and mobility: Secondary | ICD-10-CM | POA: Diagnosis present

## 2019-07-24 DIAGNOSIS — R2681 Unsteadiness on feet: Secondary | ICD-10-CM

## 2019-07-24 DIAGNOSIS — R208 Other disturbances of skin sensation: Secondary | ICD-10-CM
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
## 2019-07-24 NOTE — Patient Instructions (Signed)
Access Code: [email or web address] URL: [email or web address] Date: 07/24/2019 Prepared by: Bufford Lope  Exercises Sit to Stand - 1 x daily - 7 x weekly - 1 sets - 12 reps Seated Ankle Pumps - 1 x daily - 7 x weekly - 5 sets - 12 reps Standing March with Counter Support - 1 x daily - 7 x weekly - 1 sets - 10 reps Standing Single Leg Stance with Counter Support - 1 x daily - 7 x weekly - 2 sets - 10 hold Standing Hip Abduction with Counter Support - 1 x daily - 7 x weekly - 1 sets - 12 reps

## 2019-07-31 ENCOUNTER — Other Ambulatory Visit: Payer: Self-pay

## 2019-07-31 ENCOUNTER — Encounter: Payer: Self-pay | Admitting: Rehabilitation

## 2019-07-31 ENCOUNTER — Ambulatory Visit: Payer: Medicaid Other | Admitting: Rehabilitation

## 2019-07-31 DIAGNOSIS — M21371 Foot drop, right foot: Secondary | ICD-10-CM

## 2019-07-31 DIAGNOSIS — R2681 Unsteadiness on feet: Secondary | ICD-10-CM | POA: Diagnosis not present

## 2019-07-31 DIAGNOSIS — R208 Other disturbances of skin sensation: Secondary | ICD-10-CM

## 2019-07-31 DIAGNOSIS — M542 Cervicalgia: Secondary | ICD-10-CM

## 2019-07-31 DIAGNOSIS — R2689 Other abnormalities of gait and mobility: Secondary | ICD-10-CM

## 2019-07-31 DIAGNOSIS — M6281 Muscle weakness (generalized): Secondary | ICD-10-CM
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
## 2019-07-31 NOTE — Patient Instructions (Addendum)
   Access Code: [email or web address] URL: [email or web address] Date: 07/31/2019 Prepared by: Harriet Butte  Exercises Sit to Stand - 1 x daily - 7 x weekly - 1 sets - 12 reps Seated Ankle Pumps - 1 x daily - 7 x weekly - 5 sets - 12 reps Standing March with Counter Support - 1 x daily - 7 x weekly - 1 sets - 10 reps Standing Single Leg Stance with Counter Support - 1 x daily - 7 x weekly - 2 sets - 10 hold Standing Hip Abduction with Counter Support - 1 x daily - 7 x weekly - 1 sets - 12 reps Seated Upper Trapezius Stretch - 1 x daily - 7 x weekly - 1 sets - 3 reps - 30-60 secs hold Seated Cervical Rotation AROM - 1 x daily - 7 x weekly - 1 sets - 10 reps - 3 secs hold

## 2019-08-07 ENCOUNTER — Ambulatory Visit: Payer: Medicaid Other | Admitting: Rehabilitation

## 2019-08-07 ENCOUNTER — Telehealth: Payer: Self-pay | Admitting: Rehabilitation

## 2019-08-07 ENCOUNTER — Encounter: Payer: Self-pay | Admitting: Rehabilitation

## 2019-08-07 ENCOUNTER — Other Ambulatory Visit: Payer: Self-pay

## 2019-08-07 DIAGNOSIS — R2681 Unsteadiness on feet: Secondary | ICD-10-CM | POA: Diagnosis not present

## 2019-08-07 DIAGNOSIS — M542 Cervicalgia: Secondary | ICD-10-CM

## 2019-08-07 DIAGNOSIS — M6281 Muscle weakness (generalized): Secondary | ICD-10-CM

## 2019-08-07 DIAGNOSIS — R208 Other disturbances of skin sensation: Secondary | ICD-10-CM

## 2019-08-07 DIAGNOSIS — R2689 Other abnormalities of gait and mobility: Secondary | ICD-10-CM

## 2019-08-07 DIAGNOSIS — M21371 Foot drop, right foot: Secondary | ICD-10-CM

## 2019-08-07 NOTE — Patient Instructions (Signed)
Access Code: [email or web address] URL: [email or web address] Date: 08/07/2019 Prepared by: Harriet Butte  Exercises Sit to Stand - 1 x daily - 7 x weekly - 1 sets - 12 reps Seated Ankle Pumps - 1 x daily - 7 x weekly - 5 sets - 12 reps Standing March with Counter Support - 1 x daily - 7 x weekly - 1 sets - 10 reps Standing Single Leg Stance with Counter Support - 1 x daily - 7 x weekly - 2 sets - 10 hold Standing Hip Abduction with Counter Support - 1 x daily - 7 x weekly - 1 sets - 12 reps Seated Upper Trapezius Stretch - 1 x daily - 7 x weekly - 1 sets - 3 reps - 30-60 secs hold Seated Cervical Rotation AROM - 1 x daily - 7 x weekly - 1 sets - 10 reps - 3 secs hold Standing Upper Cervical Flexion and Extension - 1 x daily - 7 x weekly - 1 sets - 10 reps
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
## 2019-08-07 NOTE — Telephone Encounter (Signed)
Merryl Hacker,   I have been seeing [name] here at [facility] for PT and feel that she would greatly benefit from an AFO to reduce fall risk and improve efficiency of gait.  If you agree, please write order in Epic work que for LE AFO and we will get this set up for her.    I also got clearance from surgeon to work on cervical spine, could you please add this to PT order and we can include.    Thanks,  Harriet Butte, PT, MPT [facility] [address] [place], [place], [address] Phone: [phone]   Fax:  [phone] 08/07/19, 8:13 AM

## 2019-08-14 ENCOUNTER — Other Ambulatory Visit: Payer: Self-pay

## 2019-08-14 ENCOUNTER — Ambulatory Visit: Payer: Medicaid Other | Admitting: Physical Therapy

## 2019-08-14 ENCOUNTER — Encounter: Payer: Self-pay | Admitting: Physical Therapy

## 2019-08-14 DIAGNOSIS — R2681 Unsteadiness on feet: Secondary | ICD-10-CM | POA: Diagnosis not present

## 2019-08-14 DIAGNOSIS — R2689 Other abnormalities of gait and mobility: Secondary | ICD-10-CM

## 2019-08-14 DIAGNOSIS — M6281 Muscle weakness (generalized): Secondary | ICD-10-CM
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
## 2019-08-15 ENCOUNTER — Encounter: Payer: Self-pay | Admitting: *Deleted

## 2019-08-16 ENCOUNTER — Encounter: Payer: Self-pay | Admitting: Physical Therapy

## 2019-08-16 ENCOUNTER — Ambulatory Visit (INDEPENDENT_AMBULATORY_CARE_PROVIDER_SITE_OTHER): Payer: Medicaid Other | Admitting: Diagnostic Neuroimaging

## 2019-08-16 ENCOUNTER — Encounter: Payer: Self-pay | Admitting: Diagnostic Neuroimaging

## 2019-08-16 ENCOUNTER — Other Ambulatory Visit: Payer: Self-pay

## 2019-08-16 ENCOUNTER — Ambulatory Visit: Payer: Medicaid Other | Admitting: Physical Therapy

## 2019-08-16 VITALS — BP 107/74 | HR 72 | Temp 97.0°F | Ht 67.0 in | Wt 216.0 lb

## 2019-08-16 DIAGNOSIS — R2681 Unsteadiness on feet: Secondary | ICD-10-CM | POA: Diagnosis not present

## 2019-08-16 DIAGNOSIS — G43009 Migraine without aura, not intractable, without status migrainosus: Secondary | ICD-10-CM | POA: Diagnosis not present

## 2019-08-16 DIAGNOSIS — M6281 Muscle weakness (generalized): Secondary | ICD-10-CM

## 2019-08-16 DIAGNOSIS — R208 Other disturbances of skin sensation: Secondary | ICD-10-CM

## 2019-08-16 DIAGNOSIS — M542 Cervicalgia: Secondary | ICD-10-CM

## 2019-08-16 MED ORDER — AMITRIPTYLINE HCL 25 MG PO TABS: 25.0000 mg | ORAL_TABLET | Freq: Every day | ORAL | 12 refills | Status: AC

## 2019-08-16 MED ORDER — RIZATRIPTAN BENZOATE 10 MG PO TBDP: 10.0000 mg | ORAL_TABLET | ORAL | 11 refills | Status: DC | PRN

## 2019-08-16 NOTE — Patient Instructions (Signed)
MIGRAINE WITH AURA (s/p chiari decompression surgery)  MIGRAINE PREVENTION  LIFESTYLE CHANGES -Stop or avoid smoking -Decrease or avoid caffeine / alcohol -Eat and sleep on a regular schedule -Exercise several times per week - start amitriptyline 25mg  at bedtime  MIGRAINE RESCUE  - ibuprofen, tylenol as needed - rizatriptan (Maxalt) 10mg  as needed for breakthrough headache; may repeat x 1 after 2 hours; max 2 tabs per day or 8 per month
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
## 2019-08-21 ENCOUNTER — Other Ambulatory Visit: Payer: Self-pay

## 2019-08-21 ENCOUNTER — Encounter: Payer: Self-pay | Admitting: Rehabilitation

## 2019-08-21 ENCOUNTER — Ambulatory Visit: Payer: Medicaid Other | Attending: Nurse Practitioner | Admitting: Rehabilitation

## 2019-08-21 DIAGNOSIS — R209 Unspecified disturbances of skin sensation: Secondary | ICD-10-CM | POA: Insufficient documentation

## 2019-08-21 DIAGNOSIS — R2681 Unsteadiness on feet: Secondary | ICD-10-CM | POA: Insufficient documentation

## 2019-08-21 DIAGNOSIS — M6281 Muscle weakness (generalized): Secondary | ICD-10-CM | POA: Diagnosis present

## 2019-08-21 DIAGNOSIS — R2689 Other abnormalities of gait and mobility: Secondary | ICD-10-CM | POA: Insufficient documentation

## 2019-08-21 DIAGNOSIS — M542 Cervicalgia: Secondary | ICD-10-CM | POA: Insufficient documentation

## 2019-08-21 DIAGNOSIS — M21371 Foot drop, right foot: Secondary | ICD-10-CM | POA: Diagnosis present
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
## 2019-08-21 NOTE — Patient Instructions (Signed)
Access Code: [email or web address] URL: [email or web address] Date: 08/21/2019 Prepared by: Harriet Butte  Exercises Sit to Stand - 1 x daily - 7 x weekly - 1 sets - 12 reps Seated Ankle Pumps - 1 x daily - 7 x weekly - 5 sets - 12 reps Standing March with Counter Support - 1 x daily - 7 x weekly - 1 sets - 10 reps Standing Single Leg Stance with Counter Support - 1 x daily - 7 x weekly - 2 sets - 10 hold Standing Hip Abduction with Counter Support - 1 x daily - 7 x weekly - 1 sets - 12 reps Seated Upper Trapezius Stretch - 1 x daily - 7 x weekly - 1 sets - 3 reps - 30-60 secs hold Standing Upper Cervical Flexion and Extension - 1 x daily - 7 x weekly - 1 sets - 10 reps Seated Assisted Cervical Rotation with Towel - 1 x daily - 7 x weekly - 1 sets - 10 reps - 2-3 secs hold

## 2019-08-25 ENCOUNTER — Encounter: Payer: Self-pay | Admitting: Rehabilitative and Restorative Service Providers"

## 2019-08-25 ENCOUNTER — Other Ambulatory Visit: Payer: Self-pay

## 2019-08-25 ENCOUNTER — Ambulatory Visit: Payer: Medicaid Other | Admitting: Rehabilitative and Restorative Service Providers"

## 2019-08-25 DIAGNOSIS — R2681 Unsteadiness on feet: Secondary | ICD-10-CM

## 2019-08-25 DIAGNOSIS — M6281 Muscle weakness (generalized): Secondary | ICD-10-CM

## 2019-08-25 DIAGNOSIS — M542 Cervicalgia: Secondary | ICD-10-CM

## 2019-08-25 DIAGNOSIS — M21371 Foot drop, right foot: Secondary | ICD-10-CM

## 2019-08-25 DIAGNOSIS — R2689 Other abnormalities of gait and mobility: Secondary | ICD-10-CM
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
## 2019-08-28 ENCOUNTER — Ambulatory Visit: Payer: Medicaid Other | Admitting: Rehabilitation

## 2019-08-30 ENCOUNTER — Encounter: Payer: Self-pay | Admitting: Physical Therapy

## 2019-08-30 ENCOUNTER — Other Ambulatory Visit: Payer: Self-pay

## 2019-08-30 ENCOUNTER — Ambulatory Visit: Payer: Medicaid Other | Admitting: Physical Therapy

## 2019-08-30 DIAGNOSIS — R2681 Unsteadiness on feet: Secondary | ICD-10-CM

## 2019-08-30 DIAGNOSIS — M542 Cervicalgia: Secondary | ICD-10-CM | POA: Diagnosis not present

## 2019-08-30 DIAGNOSIS — M6281 Muscle weakness (generalized): Secondary | ICD-10-CM

## 2019-08-30 DIAGNOSIS — R2689 Other abnormalities of gait and mobility: Secondary | ICD-10-CM

## 2019-08-30 DIAGNOSIS — M21371 Foot drop, right foot: Secondary | ICD-10-CM
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
## 2019-09-01 ENCOUNTER — Encounter: Payer: Self-pay | Admitting: Rehabilitation

## 2019-09-01 ENCOUNTER — Ambulatory Visit: Payer: Medicaid Other | Admitting: Rehabilitation

## 2019-09-01 ENCOUNTER — Other Ambulatory Visit: Payer: Self-pay

## 2019-09-01 DIAGNOSIS — M542 Cervicalgia: Secondary | ICD-10-CM

## 2019-09-01 DIAGNOSIS — M6281 Muscle weakness (generalized): Secondary | ICD-10-CM

## 2019-09-01 DIAGNOSIS — R2689 Other abnormalities of gait and mobility: Secondary | ICD-10-CM

## 2019-09-01 DIAGNOSIS — R208 Other disturbances of skin sensation: Secondary | ICD-10-CM

## 2019-09-01 DIAGNOSIS — R2681 Unsteadiness on feet: Secondary | ICD-10-CM
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
## 2019-09-04 ENCOUNTER — Ambulatory Visit: Payer: Medicaid Other | Admitting: Rehabilitation

## 2019-09-04 ENCOUNTER — Encounter: Payer: Self-pay | Admitting: Rehabilitation

## 2019-09-04 ENCOUNTER — Other Ambulatory Visit: Payer: Self-pay

## 2019-09-04 DIAGNOSIS — M6281 Muscle weakness (generalized): Secondary | ICD-10-CM

## 2019-09-04 DIAGNOSIS — R208 Other disturbances of skin sensation: Secondary | ICD-10-CM

## 2019-09-04 DIAGNOSIS — R2689 Other abnormalities of gait and mobility: Secondary | ICD-10-CM

## 2019-09-04 DIAGNOSIS — M542 Cervicalgia: Secondary | ICD-10-CM | POA: Diagnosis not present

## 2019-09-04 DIAGNOSIS — R2681 Unsteadiness on feet: Secondary | ICD-10-CM
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
## 2019-09-06 ENCOUNTER — Ambulatory Visit: Payer: Medicaid Other | Admitting: Physical Therapy

## 2019-09-06 ENCOUNTER — Encounter: Payer: Self-pay | Admitting: Physical Therapy

## 2019-09-06 ENCOUNTER — Other Ambulatory Visit: Payer: Self-pay

## 2019-09-06 DIAGNOSIS — M542 Cervicalgia: Secondary | ICD-10-CM | POA: Diagnosis not present

## 2019-09-06 DIAGNOSIS — R208 Other disturbances of skin sensation: Secondary | ICD-10-CM

## 2019-09-06 DIAGNOSIS — M6281 Muscle weakness (generalized): Secondary | ICD-10-CM

## 2019-09-06 DIAGNOSIS — R2681 Unsteadiness on feet: Secondary | ICD-10-CM

## 2019-09-06 DIAGNOSIS — M21371 Foot drop, right foot: Secondary | ICD-10-CM

## 2019-09-06 DIAGNOSIS — R2689 Other abnormalities of gait and mobility: Secondary | ICD-10-CM
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
## 2019-09-08 ENCOUNTER — Ambulatory Visit: Payer: Medicaid Other | Admitting: Rehabilitation

## 2019-09-11 ENCOUNTER — Encounter: Payer: Self-pay | Admitting: Rehabilitation

## 2019-09-11 ENCOUNTER — Ambulatory Visit: Payer: Medicaid Other | Admitting: Rehabilitation

## 2019-09-11 ENCOUNTER — Other Ambulatory Visit: Payer: Self-pay

## 2019-09-11 DIAGNOSIS — M542 Cervicalgia: Secondary | ICD-10-CM

## 2019-09-11 DIAGNOSIS — M6281 Muscle weakness (generalized): Secondary | ICD-10-CM

## 2019-09-11 DIAGNOSIS — R2689 Other abnormalities of gait and mobility: Secondary | ICD-10-CM

## 2019-09-11 DIAGNOSIS — R208 Other disturbances of skin sensation: Secondary | ICD-10-CM

## 2019-09-11 DIAGNOSIS — R2681 Unsteadiness on feet: Secondary | ICD-10-CM
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
## 2019-09-15 ENCOUNTER — Ambulatory Visit: Payer: Medicaid Other | Admitting: Rehabilitation

## 2019-09-19 ENCOUNTER — Ambulatory Visit: Payer: Medicaid Other | Attending: Nurse Practitioner | Admitting: Physical Therapy

## 2019-09-19 ENCOUNTER — Encounter: Payer: Self-pay | Admitting: Physical Therapy

## 2019-09-19 ENCOUNTER — Other Ambulatory Visit: Payer: Self-pay

## 2019-09-19 DIAGNOSIS — R209 Unspecified disturbances of skin sensation: Secondary | ICD-10-CM | POA: Diagnosis present

## 2019-09-19 DIAGNOSIS — R208 Other disturbances of skin sensation: Secondary | ICD-10-CM

## 2019-09-19 DIAGNOSIS — R2681 Unsteadiness on feet: Secondary | ICD-10-CM | POA: Insufficient documentation

## 2019-09-19 DIAGNOSIS — R2689 Other abnormalities of gait and mobility: Secondary | ICD-10-CM | POA: Diagnosis present

## 2019-09-19 DIAGNOSIS — M6281 Muscle weakness (generalized): Secondary | ICD-10-CM | POA: Insufficient documentation
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
## 2019-09-22 ENCOUNTER — Ambulatory Visit: Payer: Medicaid Other | Admitting: Physical Therapy

## 2019-09-22 ENCOUNTER — Other Ambulatory Visit: Payer: Self-pay

## 2019-09-22 ENCOUNTER — Encounter: Payer: Self-pay | Admitting: Physical Therapy

## 2019-09-22 DIAGNOSIS — R2689 Other abnormalities of gait and mobility: Secondary | ICD-10-CM

## 2019-09-22 DIAGNOSIS — R2681 Unsteadiness on feet: Secondary | ICD-10-CM

## 2019-09-22 DIAGNOSIS — R208 Other disturbances of skin sensation: Secondary | ICD-10-CM

## 2019-09-22 DIAGNOSIS — M6281 Muscle weakness (generalized): Secondary | ICD-10-CM | POA: Diagnosis not present
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
## 2019-10-04 ENCOUNTER — Other Ambulatory Visit (HOSPITAL_COMMUNITY): Payer: Self-pay | Admitting: Gastroenterology

## 2019-10-04 ENCOUNTER — Other Ambulatory Visit: Payer: Self-pay | Admitting: Gastroenterology

## 2019-10-04 DIAGNOSIS — R1312 Dysphagia, oropharyngeal phase: Secondary | ICD-10-CM

## 2019-10-05 ENCOUNTER — Other Ambulatory Visit (HOSPITAL_COMMUNITY): Payer: Self-pay | Admitting: *Deleted

## 2019-10-05 DIAGNOSIS — R1310 Dysphagia, unspecified: Secondary | ICD-10-CM

## 2019-10-12 ENCOUNTER — Ambulatory Visit (HOSPITAL_COMMUNITY): Payer: Medicaid Other

## 2019-10-12 ENCOUNTER — Encounter (HOSPITAL_COMMUNITY): Payer: Self-pay

## 2019-10-26 ENCOUNTER — Ambulatory Visit (HOSPITAL_COMMUNITY): Admission: RE | Admit: 2019-10-26 | Payer: Medicaid Other | Source: Ambulatory Visit

## 2019-10-26 ENCOUNTER — Other Ambulatory Visit: Payer: Self-pay

## 2019-10-26 ENCOUNTER — Encounter (HOSPITAL_COMMUNITY): Payer: Self-pay

## 2019-11-02 ENCOUNTER — Other Ambulatory Visit: Payer: Self-pay

## 2019-11-02 ENCOUNTER — Encounter (HOSPITAL_COMMUNITY): Payer: Self-pay

## 2019-11-02 ENCOUNTER — Ambulatory Visit (HOSPITAL_COMMUNITY)
Admission: EM | Admit: 2019-11-02 | Discharge: 2019-11-02 | Disposition: A | Discharge status: Home / Self Care | Payer: Medicaid Other

## 2019-11-02 NOTE — Progress Notes (Signed)
Patient denies SI/HI/Psychosis/Substance Abuse.  Patient requests an appt with [facility] to see a psychiatrist and a therapist.  Patient was scheduled

## 2019-11-28 ENCOUNTER — Ambulatory Visit (HOSPITAL_COMMUNITY): Payer: Medicaid Other | Admitting: Clinical

## 2019-12-20 ENCOUNTER — Ambulatory Visit (HOSPITAL_COMMUNITY): Payer: Medicaid Other | Admitting: Psychiatry

## 2020-01-18 ENCOUNTER — Encounter: Payer: Self-pay | Admitting: *Deleted

## 2020-01-18 ENCOUNTER — Telehealth: Payer: Self-pay | Admitting: *Deleted

## 2020-01-18 NOTE — Telephone Encounter (Signed)
Received fax from [facility] for rizatriptan PA. [facility] triptans PA form filled out, faxed to [facility]. Sent my chart to advise patient.

## 2020-01-22 ENCOUNTER — Encounter: Payer: Self-pay | Admitting: *Deleted

## 2020-01-22 NOTE — Telephone Encounter (Addendum)
Called [facility] to check status of rizatriptan PA. Spoke with Vincenza Hews who stated that it was denied due to exceeding quantity limit of 12. I advised Rx if for 9 tabs/30 days. He stated it may have tried to fill early, but pharmacy received a paid claim for it today.   Call Ref #[id number].  Called pharmacy, spoke with Joselyn Glassman who stated the Rx is in process of being filled, #9 tabs $3.00. Sent patient my chart to advise.

## 2020-02-15 ENCOUNTER — Ambulatory Visit: Payer: Medicaid Other | Admitting: Family Medicine

## 2020-02-15 ENCOUNTER — Encounter: Payer: Self-pay | Admitting: Family Medicine

## 2020-02-15 VITALS — BP 95/65 | HR 89 | Ht 67.0 in | Wt 222.2 lb

## 2020-02-15 DIAGNOSIS — G43711 Chronic migraine without aura, intractable, with status migrainosus: Secondary | ICD-10-CM

## 2020-02-15 DIAGNOSIS — G8929 Other chronic pain: Secondary | ICD-10-CM

## 2020-02-15 DIAGNOSIS — G935 Compression of brain: Secondary | ICD-10-CM | POA: Diagnosis not present

## 2020-02-15 MED ORDER — AJOVY 225 MG/1.5ML ~~LOC~~ SOAJ: 1.5000 mg | SUBCUTANEOUS | 11 refills | Status: AC

## 2020-02-15 MED ORDER — SUMATRIPTAN SUCCINATE 100 MG PO TABS: 100.0000 mg | ORAL_TABLET | Freq: Once | ORAL | 11 refills | Status: AC | PRN
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
## 2020-02-15 NOTE — Patient Instructions (Addendum)
Below is our plan:  We will continue amitriptyline 25mg  at bedtime. We will start Ajovy injections every 30 days. I am going to switch rizatriptan to sumatriptan. Take 1 tablet at onset of migraine. You may repeat 1 tablet in 2 hours, no more than 2 tablet in 24 hour or 9 per month.   Please make sure you are staying well hydrated. I recommend 50-60 ounces daily. Well balanced diet and regular exercise encouraged.    Please continue follow up with care team as directed.   Follow up in 6 months   You may receive a survey regarding today's visit. I encourage you to leave honest feed back as I do use this information to improve patient care. Thank you for seeing me today!      Fremanezumab injection What is this medicine? FREMANEZUMAB (fre ma NEZ ue mab) is used to prevent migraine headaches. This medicine may be used for other purposes; ask your health care provider or pharmacist if you have questions. COMMON BRAND NAME(S): AJOVY What should I tell my health care provider before I take this medicine? They need to know if you have any of these conditions:  an unusual or allergic reaction to fremanezumab, other medicines, foods, dyes, or preservatives  pregnant or trying to get pregnant  breast-feeding How should I use this medicine? This medicine is for injection under the skin. You will be taught how to prepare and give this medicine. Use exactly as directed. Take your medicine at regular intervals. Do not take your medicine more often than directed. It is important that you put your used needles and syringes in a special sharps container. Do not put them in a trash can. If you do not have a sharps container, call your pharmacist or healthcare provider to get one. Talk to your pediatrician regarding the use of this medicine in children. Special care may be needed. Overdosage: If you think you have taken too much of this medicine contact a poison control center or emergency room at  once. NOTE: This medicine is only for you. Do not share this medicine with others. What if I miss a dose? If you miss a dose, take it as soon as you can. If it is almost time for your next dose, take only that dose. Do not take double or extra doses. What may interact with this medicine? Interactions are not expected. This list may not describe all possible interactions. Give your health care provider a list of all the medicines, herbs, non-prescription drugs, or dietary supplements you use. Also tell them if you smoke, drink alcohol, or use illegal drugs. Some items may interact with your medicine. What should I watch for while using this medicine? Tell your doctor or healthcare professional if your symptoms do not start to get better or if they get worse. What side effects may I notice from receiving this medicine? Side effects that you should report to your doctor or health care professional as soon as possible:  allergic reactions like skin rash, itching or hives, swelling of the face, lips, or tongue Side effects that usually do not require medical attention (report these to your doctor or health care professional if they continue or are bothersome):  pain, redness, or irritation at site where injected This list may not describe all possible side effects. Call your doctor for medical advice about side effects. You may report side effects to FDA at 1-800-FDA-1088. Where should I keep my medicine? Keep out of the reach of  children. You will be instructed on how to store this medicine. Throw away any unused medicine after the expiration date on the label. NOTE: This sheet is a summary. It may not cover all possible information. If you have questions about this medicine, talk to your doctor, pharmacist, or health care provider.  2020 Elsevier/Gold Standard (2017-01-04 17:22:56)   Sumatriptan tablets What is this medicine? SUMATRIPTAN (soo ma TRIP tan) is used to treat migraines with or  without aura. An aura is a strange feeling or visual disturbance that warns you of an attack. It is not used to prevent migraines. This medicine may be used for other purposes; ask your health care provider or pharmacist if you have questions. COMMON BRAND NAME(S): Imitrex, Migraine Pack What should I tell my health care provider before I take this medicine? They need to know if you have any of these conditions:  cigarette smoker  circulation problems in fingers and toes  diabetes  heart disease  high blood pressure  high cholesterol  history of irregular heartbeat  history of stroke  kidney disease  liver disease  stomach or intestine problems  an unusual or allergic reaction to sumatriptan, other medicines, foods, dyes, or preservatives  pregnant or trying to get pregnant  breast-feeding How should I use this medicine? Take this medicine by mouth with a glass of water. Follow the directions on the prescription label. Do not take it more often than directed. Talk to your pediatrician regarding the use of this medicine in children. Special care may be needed. Overdosage: If you think you have taken too much of this medicine contact a poison control center or emergency room at once. NOTE: This medicine is only for you. Do not share this medicine with others. What if I miss a dose? This does not apply. This medicine is not for regular use. What may interact with this medicine? Do not take this medicine with any of the following medicines:  certain medicines for migraine headache like almotriptan, eletriptan, frovatriptan, naratriptan, rizatriptan, sumatriptan, zolmitriptan  ergot alkaloids like dihydroergotamine, ergonovine, ergotamine, methylergonovine  MAOIs like Carbex, Eldepryl, Marplan, Nardil, and Parnate This medicine may also interact with the following medications:  certain medicines for depression, anxiety, or psychotic disorders This list may not describe  all possible interactions. Give your health care provider a list of all the medicines, herbs, non-prescription drugs, or dietary supplements you use. Also tell them if you smoke, drink alcohol, or use illegal drugs. Some items may interact with your medicine. What should I watch for while using this medicine? Visit your healthcare professional for regular checks on your progress. Tell your healthcare professional if your symptoms do not start to get better or if they get worse. You may get drowsy or dizzy. Do not drive, use machinery, or do anything that needs mental alertness until you know how this medicine affects you. Do not stand up or sit up quickly, especially if you are an older patient. This reduces the risk of dizzy or fainting spells. Alcohol may interfere with the effect of this medicine. Tell your healthcare professional right away if you have any change in your eyesight. If you take migraine medicines for 10 or more days a month, your migraines may get worse. Keep a diary of headache days and medicine use. Contact your healthcare professional if your migraine attacks occur more frequently. What side effects may I notice from receiving this medicine? Side effects that you should report to your doctor or health care  professional as soon as possible:  allergic reactions like skin rash, itching or hives, swelling of the face, lips, or tongue  changes in vision  chest pain or chest tightness  signs and symptoms of a dangerous change in heartbeat or heart rhythm like chest pain; dizziness; fast, irregular heartbeat; palpitations; feeling faint or lightheaded; falls; breathing problems  signs and symptoms of a stroke like changes in vision; confusion; trouble speaking or understanding; severe headaches; sudden numbness or weakness of the face, arm or leg; trouble walking; dizziness; loss of balance or coordination  signs and symptoms of serotonin syndrome like irritable; confusion;  diarrhea; fast or irregular heartbeat; muscle twitching; stiff muscles; trouble walking; sweating; high fever; seizures; chills; vomiting Side effects that usually do not require medical attention (report to your doctor or health care professional if they continue or are bothersome):  diarrhea  dizziness  drowsiness  dry mouth  headache  nausea, vomiting  pain, tingling, numbness in the hands or feet  stomach pain This list may not describe all possible side effects. Call your doctor for medical advice about side effects. You may report side effects to FDA at 1-800-FDA-1088. Where should I keep my medicine? Keep out of the reach of children. Store at room temperature between 2 and 30 degrees C (36 and 86 degrees F). Throw away any unused medicine after the expiration date. NOTE: This sheet is a summary. It may not cover all possible information. If you have questions about this medicine, talk to your doctor, pharmacist, or health care provider.  2020 Elsevier/Gold Standard (2017-10-19 15:05:37)    Analgesic Rebound Headache An analgesic rebound headache, sometimes called a medication overuse headache, is a headache that comes after pain medicine (analgesic) taken to treat the original (primary) headache has worn off. Any type of primary headache can return as a rebound headache if a person regularly takes analgesics more than three times a week to treat it. The types of primary headaches that are commonly associated with rebound headaches include:  Migraines.  Headaches that arise from tense muscles in the head and neck area (tension headaches).  Headaches that develop and happen again (recur) on one side of the head and around the eye (cluster headaches). If rebound headaches continue, they become chronic daily headaches. What are the causes? This condition may be caused by frequent use of:  Over-the-counter medicines such as aspirin, ibuprofen, and acetaminophen.  Sinus  relief medicines and other medicines that contain caffeine.  Narcotic pain medicines such as codeine and oxycodone. What are the signs or symptoms? The symptoms of a rebound headache are the same as the symptoms of the original headache. Some of the symptoms of specific types of headaches include: Migraine headache  Pulsing or throbbing pain on one or both sides of the head.  Severe pain that interferes with daily activities.  Pain that is worsened by physical activity.  Nausea, vomiting, or both.  Pain with exposure to bright light, loud noises, or strong smells.  General sensitivity to bright light, loud noises, or strong smells.  Visual changes.  Numbness of one or both arms. Tension headache  Pressure around the head.  Dull, aching head pain.  Pain felt over the front and sides of the head.  Tenderness in the muscles of the head, neck, and shoulders. Cluster headache  Severe pain that begins in or around one eye or temple.  Redness and tearing in the eye on the same side as the pain.  Droopy or swollen  eyelid.  One-sided head pain.  Nausea.  Runny nose.  Sweaty, pale facial skin.  Restlessness. How is this diagnosed? This condition is diagnosed by:  Reviewing your medical history. This includes the nature of your primary headaches.  Reviewing the types of pain medicines that you have been using to treat your headaches and how often you take them. How is this treated? This condition may be treated or managed by:  Discontinuing frequent use of the analgesic medicine. Doing this may worsen your headaches at first, but the pain should eventually become more manageable, less frequent, and less severe.  Seeing a headache specialist. He or she may be able to help you manage your headaches and help make sure there is not another cause of the headaches.  Using methods of stress relief, such as acupuncture, counseling, biofeedback, and massage. Talk with your  health care provider about which methods might be good for you. Follow these instructions at home:  Take over-the-counter and prescription medicines only as told by your health care provider.  Stop the repeated use of pain medicine as told by your health care provider. Stopping can be difficult. Carefully follow instructions from your health care provider.  Avoid triggers that are known to cause your primary headaches.  Keep all follow-up visits as told by your health care provider. This is important. Contact a health care provider if:  You continue to experience headaches after following treatments that your health care provider recommended. Get help right away if:  You develop new headache pain.  You develop headache pain that is different than what you have experienced in the past.  You develop numbness or tingling in your arms or legs.  You develop changes in your speech or vision. This information is not intended to replace advice given to you by your health care provider. Make sure you discuss any questions you have with your health care provider. Document Revised: 03/19/2017 Document Reviewed: 09/09/2015 Elsevier Patient Education  2020 Elsevier Inc.   Chiari Malformation  Chiari malformation (CM) is a type of brain abnormality that affects the parts of the brain called the cerebellum and the brain stem. The cerebellum is important for balance and the brain stem is important for basic body functions, such as breathing and swallowing. Normally, the cerebellum is located in a space at the back of the skull, just above the opening in the skull (foramen magnum)where the spinal cord meets the brain stem. With CM, part of the cerebellum is located below the foramen magnum instead. The malformation can be mild with no or few symptoms, or it can be severe. CM can cause neck pain, headaches, balance problems, and other symptoms. What are the causes? CM is a condition that a person is  born with (congenital). In rare cases, CM may also develop later in life (acquired CM or secondary CM). These cases may be caused by a leak of the fluid (cerebrospinal fluid) around the brain and spinal cord, leading to low pressure. In acquired or secondary CM, abnormal pressure develops in the brain. This pushes the cerebellum down into the foramen magnum. What increases the risk? The following factors may make you more likely to develop this condition:  Being female.  Having a family history of CM. What are the signs or symptoms? Symptoms of this condition may vary depending on the severity of your CM. In some cases, you may not have any symptoms. In other cases, symptoms may come and go. The most common symptom is  a severe headache in the back of the head. The headache:  May come and go.  May spread to your neck and shoulders.  May be worse when you cough, sneeze, or strain. Other symptoms include:  Difficulty balancing.  Loss of coordination.  Trouble swallowing or speaking.  Muscle weakness.  Feeling dizzy.  Ringing in the ears.  Fainting.  Trouble sleeping.  Fatigue.  Tingling or burning sensations in the fingers or toes.  Hearing problems.  Vision problems.  Vomiting.  Depression.  Seizures, in severe cases. How is this diagnosed? This condition may be evaluated with a medical history and physical exam. This may include tests to check your balance and nerves (neurological exam). You may also have imaging tests, such as CT scan or MRI. How is this treated? Treatment for this condition depends on the severity of your symptoms. You may be treated with:  Surgery to prevent the malformation from getting worse, or to treat severe symptoms or symptoms that are getting worse.  Medicines or alternative treatments to relieve headaches or neck pain. If you do not have symptoms, you may not need treatment. Follow these instructions at home: If you have  seizures:  Do not drive, swim, or do any other activities that would be dangerous if you had a seizure. Wait until your health care provider says it is safe to do them.  Avoid any substances that may prevent your medicine from working properly, such as alcohol.  Check with your local Department of Motor Vehicles [facility]) to find out about local driving laws. Each state has specific rules about when you can legally return to driving.  Get enough rest. Lack of sleep can make seizures more likely to occur. Medicines  Take over-the-counter and prescription medicines only as told by your health care provider.  Do not drive or use heavy machinery while taking prescription pain medicine.  If you are taking blood pressure or heart medicine, get up slowly and take several minutes to sit and then stand. This can reduce dizziness. General instructions  If you feel like you might faint: ? Lie down right away and raise (elevate) your feet above the level of your heart. ? Breathe deeply and steadily. Wait until all of the symptoms have passed.  Ask your health care provider which activities are safe for you, and if you have any activity restrictions.  Do not use any products that contain nicotine or tobacco, such as cigarettes and e-cigarettes. If you need help quitting, ask your health care provider.  Drink enough fluid to keep your urine pale yellow.  Consider joining a CM support group.  Keep all follow-up visits as told by your health care provider. This is important. Contact a health care provider if:  You have new symptoms.  Your symptoms get worse. Get help right away if:  You have seizures that are new or different from other seizures that you have had.  You develop weakness or numbness in one or all of your limbs.  You develop dizziness, slurred speech, double vision, weakness, or numbness with a severe headache. Summary  A Chiari malformation is a condition in which part of the  cerebellum moves through the foramen magnum.  The malformation can be mild with no symptoms, or it can be severe.  In some patients, no treatment is needed. In others, medicines are used to treat headaches. Surgery is done in the worst of cases. This information is not intended to replace advice given to you by  your health care provider. Make sure you discuss any questions you have with your health care provider. Document Revised: 04/08/2017 Document Reviewed: 01/13/2017 Elsevier Patient Education  2020 [employer].   Migraine Headache A migraine headache is a very strong throbbing pain on one side or both sides of your head. This type of headache can also cause other symptoms. It can last from 4 hours to 3 days. Talk with your doctor about what things may bring on (trigger) this condition. What are the causes? The exact cause of this condition is not known. This condition may be triggered or caused by:  Drinking alcohol.  Smoking.  Taking medicines, such as: ? Medicine used to treat chest pain (nitroglycerin). ? Birth control pills. ? Estrogen. ? Some blood pressure medicines.  Eating or drinking certain products.  Doing physical activity. Other things that may trigger a migraine headache include:  Having a menstrual period.  Pregnancy.  Hunger.  Stress.  Not getting enough sleep or getting too much sleep.  Weather changes.  Tiredness (fatigue). What increases the risk?  Being 53-66 years old.  Being female.  Having a family history of migraine headaches.  Being Caucasian.  Having depression or anxiety.  Being very overweight. What are the signs or symptoms?  A throbbing pain. This pain may: ? Happen in any area of the head, such as on one side or both sides. ? Make it hard to do daily activities. ? Get worse with physical activity. ? Get worse around bright lights or loud noises.  Other symptoms may include: ? Feeling sick to your stomach  (nauseous). ? Vomiting. ? Dizziness. ? Being sensitive to bright lights, loud noises, or smells.  Before you get a migraine headache, you may get warning signs (an aura). An aura may include: ? Seeing flashing lights or having blind spots. ? Seeing bright spots, halos, or zigzag lines. ? Having tunnel vision or blurred vision. ? Having numbness or a tingling feeling. ? Having trouble talking. ? Having weak muscles.  Some people have symptoms after a migraine headache (postdromal phase), such as: ? Tiredness. ? Trouble thinking (concentrating). How is this treated?  Taking medicines that: ? Relieve pain. ? Relieve the feeling of being sick to your stomach. ? Prevent migraine headaches.  Treatment may also include: ? Having acupuncture. ? Avoiding foods that bring on migraine headaches. ? Learning ways to control your body functions (biofeedback). ? Therapy to help you know and deal with negative thoughts (cognitive behavioral therapy). Follow these instructions at home: Medicines  Take over-the-counter and prescription medicines only as told by your doctor.  Ask your doctor if the medicine prescribed to you: ? Requires you to avoid driving or using heavy machinery. ? Can cause trouble pooping (constipation). You may need to take these steps to prevent or treat trouble pooping:  Drink enough fluid to keep your pee (urine) pale yellow.  Take over-the-counter or prescription medicines.  Eat foods that are high in fiber. These include beans, whole grains, and fresh fruits and vegetables.  Limit foods that are high in fat and sugar. These include fried or sweet foods. Lifestyle  Do not drink alcohol.  Do not use any products that contain nicotine or tobacco, such as cigarettes, e-cigarettes, and chewing tobacco. If you need help quitting, ask your doctor.  Get at least 8 hours of sleep every night.  Limit and deal with stress. General instructions      Keep a  journal to find out what  may bring on your migraine headaches. For example, write down: ? What you eat and drink. ? How much sleep you get. ? Any change in what you eat or drink. ? Any change in your medicines.  If you have a migraine headache: ? Avoid things that make your symptoms worse, such as bright lights. ? It may help to lie down in a dark, quiet room. ? Do not drive or use heavy machinery. ? Ask your doctor what activities are safe for you.  Keep all follow-up visits as told by your doctor. This is important. Contact a doctor if:  You get a migraine headache that is different or worse than others you have had.  You have more than 15 headache days in one month. Get help right away if:  Your migraine headache gets very bad.  Your migraine headache lasts longer than 72 hours.  You have a fever.  You have a stiff neck.  You have trouble seeing.  Your muscles feel weak or like you cannot control them.  You start to lose your balance a lot.  You start to have trouble walking.  You pass out (faint).  You have a seizure. Summary  A migraine headache is a very strong throbbing pain on one side or both sides of your head. These headaches can also cause other symptoms.  This condition may be treated with medicines and changes to your lifestyle.  Keep a journal to find out what may bring on your migraine headaches.  Contact a doctor if you get a migraine headache that is different or worse than others you have had.  Contact your doctor if you have more than 15 headache days in a month. This information is not intended to replace advice given to you by your health care provider. Make sure you discuss any questions you have with your health care provider. Document Revised: 07/29/2018 Document Reviewed: 05/19/2018 Elsevier Patient Education  2020 [employer].

## 2020-02-19 ENCOUNTER — Telehealth: Payer: Self-pay

## 2020-02-19 NOTE — Telephone Encounter (Signed)
A PA sent for sumatriptan via [facility]  Form [facility] Prior Approval Request for Standard Drug Request Form Pharmacy Prior Approval Request for Standard Drug Request Form for [place] Medicaid and [place] [facility] [phone]  [facility] The plan will fax you a determination, typically within 1 to 5 business days.

## 2020-02-21 NOTE — Progress Notes (Signed)
PA submitted for Ajovy 225mg  on Cover my Meds. There is a 24/72hr turn around. PA #: Key: [id number] Status: PENDING  [employer] The plan will fax you a determination, typically within 1 to 5 business days.
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
## 2020-03-12 NOTE — Progress Notes (Signed)
I reviewed note and agree with plan.   Suanne Marker, MD 03/12/2020, 2:40 PM Certified in Neurology, Neurophysiology and Neuroimaging  [facility] [address] [place], [place] [address] [phone]

## 2020-04-12 ENCOUNTER — Ambulatory Visit (HOSPITAL_COMMUNITY)
Admission: EM | Admit: 2020-04-12 | Discharge: 2020-04-12 | Disposition: A | Discharge status: Home / Self Care | Payer: Medicaid Other | Attending: Psychiatry | Admitting: Psychiatry

## 2020-04-12 ENCOUNTER — Other Ambulatory Visit: Payer: Self-pay

## 2020-04-12 ENCOUNTER — Encounter (HOSPITAL_COMMUNITY): Payer: Self-pay

## 2020-04-12 DIAGNOSIS — G935 Compression of brain: Secondary | ICD-10-CM

## 2020-04-12 DIAGNOSIS — F32A Depression, unspecified: Secondary | ICD-10-CM | POA: Diagnosis not present

## 2020-04-12 DIAGNOSIS — R450 Nervousness: Secondary | ICD-10-CM | POA: Insufficient documentation

## 2020-04-12 DIAGNOSIS — G4700 Insomnia, unspecified: Secondary | ICD-10-CM | POA: Diagnosis not present

## 2020-04-12 NOTE — BH Assessment (Signed)
Clinician spoke to [facility] with [facility]  (non-emergency number) to see if [employer] can take the pt home. Clinician noted from [place] she will have to get approval and if it is approved there's an "extended, extended" wait; the pt could be waiting for several hours. Clinician communicated information to the pt and Jonelle, RN.     Redmond Pulling, MS, [facility], [facility] Triage Specialist [phone]
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
## 2020-04-12 NOTE — ED Notes (Signed)
AVS reviewed with patient, questions answered. Attempting to arrange transport as [employer] states they may not be able to return her home and if they can it will be hours.
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
## 2020-04-12 NOTE — ED Notes (Signed)
Lyft ride arranged and patient escorted to car after belongings retrienved. AVS reviewed and patient encouraged to return or call for help if further issues arise. Patient in no distress at time of discharge.
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
## 2020-08-15 ENCOUNTER — Ambulatory Visit: Payer: Medicaid Other | Admitting: Family Medicine

## 2020-09-03 ENCOUNTER — Ambulatory Visit: Payer: Medicaid Other | Admitting: Family Medicine

## 2020-09-03 ENCOUNTER — Encounter: Payer: Self-pay | Admitting: Family Medicine

## 2020-09-03 NOTE — Patient Instructions (Incomplete)
Below is our plan:  We will ***  Please make sure you are staying well hydrated. I recommend 50-60 ounces daily. Well balanced diet and regular exercise encouraged. Consistent sleep schedule with 6-8 hours recommended.   Please continue follow up with care team as directed.   Follow up with *** in ***  You may receive a survey regarding today's visit. I encourage you to leave honest feed back as I do use this information to improve patient care. Thank you for seeing me today!      Migraine Headache A migraine headache is a very strong throbbing pain on one side or both sides of your head. This type of headache can also cause other symptoms. It can last from 4 hours to 3 days. Talk with your doctor about what things may bring on (trigger) this condition. What are the causes? The exact cause of this condition is not known. This condition may be triggered or caused by:  Drinking alcohol.  Smoking.  Taking medicines, such as: ? Medicine used to treat chest pain (nitroglycerin). ? Birth control pills. ? Estrogen. ? Some blood pressure medicines.  Eating or drinking certain products.  Doing physical activity. Other things that may trigger a migraine headache include:  Having a menstrual period.  Pregnancy.  Hunger.  Stress.  Not getting enough sleep or getting too much sleep.  Weather changes.  Tiredness (fatigue). What increases the risk?  Being 25-55 years old.  Being female.  Having a family history of migraine headaches.  Being Caucasian.  Having depression or anxiety.  Being very overweight. What are the signs or symptoms?  A throbbing pain. This pain may: ? Happen in any area of the head, such as on one side or both sides. ? Make it hard to do daily activities. ? Get worse with physical activity. ? Get worse around bright lights or loud noises.  Other symptoms may include: ? Feeling sick to your stomach  (nauseous). ? Vomiting. ? Dizziness. ? Being sensitive to bright lights, loud noises, or smells.  Before you get a migraine headache, you may get warning signs (an aura). An aura may include: ? Seeing flashing lights or having blind spots. ? Seeing bright spots, halos, or zigzag lines. ? Having tunnel vision or blurred vision. ? Having numbness or a tingling feeling. ? Having trouble talking. ? Having weak muscles.  Some people have symptoms after a migraine headache (postdromal phase), such as: ? Tiredness. ? Trouble thinking (concentrating). How is this treated?  Taking medicines that: ? Relieve pain. ? Relieve the feeling of being sick to your stomach. ? Prevent migraine headaches.  Treatment may also include: ? Having acupuncture. ? Avoiding foods that bring on migraine headaches. ? Learning ways to control your body functions (biofeedback). ? Therapy to help you know and deal with negative thoughts (cognitive behavioral therapy). Follow these instructions at home: Medicines  Take over-the-counter and prescription medicines only as told by your doctor.  Ask your doctor if the medicine prescribed to you: ? Requires you to avoid driving or using heavy machinery. ? Can cause trouble pooping (constipation). You may need to take these steps to prevent or treat trouble pooping:  Drink enough fluid to keep your pee (urine) pale yellow.  Take over-the-counter or prescription medicines.  Eat foods that are high in fiber. These include beans, whole grains, and fresh fruits and vegetables.  Limit foods that are high in fat and sugar. These include fried or sweet foods. Lifestyle    Do not drink alcohol.  Do not use any products that contain nicotine or tobacco, such as cigarettes, e-cigarettes, and chewing tobacco. If you need help quitting, ask your doctor.  Get at least 8 hours of sleep every night.  Limit and deal with stress. General instructions  Keep a journal to  find out what may bring on your migraine headaches. For example, write down: ? What you eat and drink. ? How much sleep you get. ? Any change in what you eat or drink. ? Any change in your medicines.  If you have a migraine headache: ? Avoid things that make your symptoms worse, such as bright lights. ? It may help to lie down in a dark, quiet room. ? Do not drive or use heavy machinery. ? Ask your doctor what activities are safe for you.  Keep all follow-up visits as told by your doctor. This is important.      Contact a doctor if:  You get a migraine headache that is different or worse than others you have had.  You have more than 15 headache days in one month. Get help right away if:  Your migraine headache gets very bad.  Your migraine headache lasts longer than 72 hours.  You have a fever.  You have a stiff neck.  You have trouble seeing.  Your muscles feel weak or like you cannot control them.  You start to lose your balance a lot.  You start to have trouble walking.  You pass out (faint).  You have a seizure. Summary  A migraine headache is a very strong throbbing pain on one side or both sides of your head. These headaches can also cause other symptoms.  This condition may be treated with medicines and changes to your lifestyle.  Keep a journal to find out what may bring on your migraine headaches.  Contact a doctor if you get a migraine headache that is different or worse than others you have had.  Contact your doctor if you have more than 15 headache days in a month. This information is not intended to replace advice given to you by your health care provider. Make sure you discuss any questions you have with your health care provider. Document Revised: 07/29/2018 Document Reviewed: 05/19/2018 Elsevier Patient Education  2021 Elsevier Inc.  
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
## 2020-11-12 IMAGING — CT CT RENAL STONE PROTOCOL
2 of 4 series · 16 of 46 positions shown, 18 images · non-contrast
Comparison: None.

CLINICAL DATA: Flank pain. Left-sided flank pain that radiates to
left lower extremity.

EXAM:
CT ABDOMEN AND PELVIS WITHOUT CONTRAST
TECHNIQUE: Multidetector CT imaging of the abdomen and pelvis was performed
following the standard protocol without IV contrast.

[Series 2: axial st · axial · 0.87mm/px · z∈[+1167,+1592]mm · 13 of 96 slices shown, 15 images]
[im 6/96  soft-tissue]
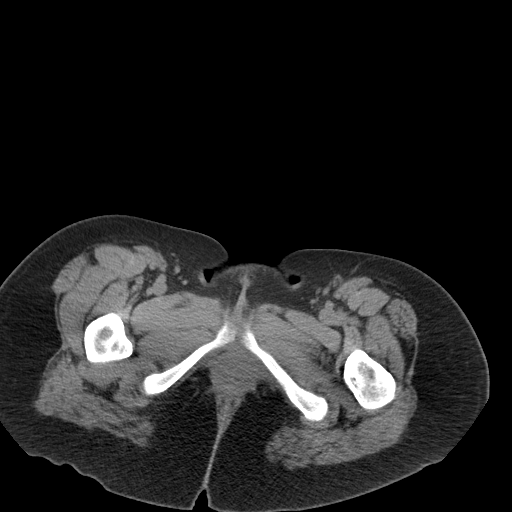
[im 6/96  bone]
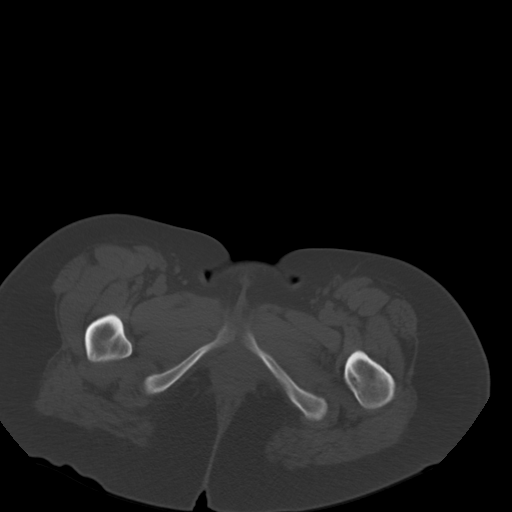
[im 16/96  soft-tissue]
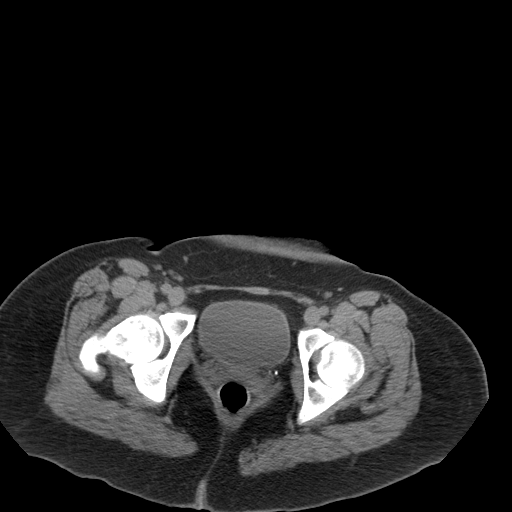
[im 21/96  soft-tissue]
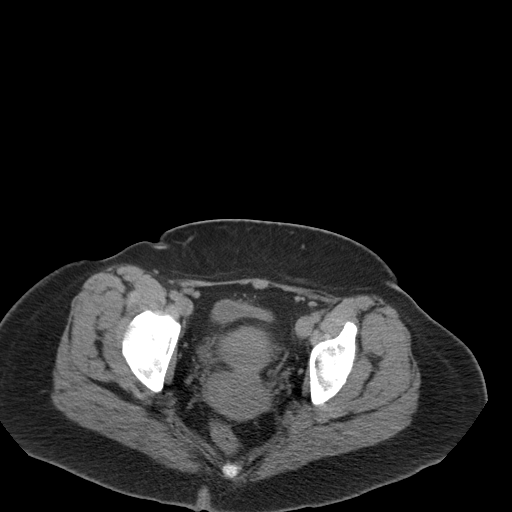
[im 26/96  soft-tissue]
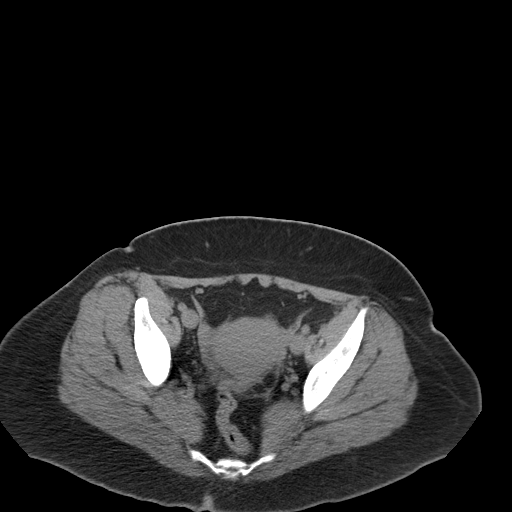
[im 36/96  soft-tissue]
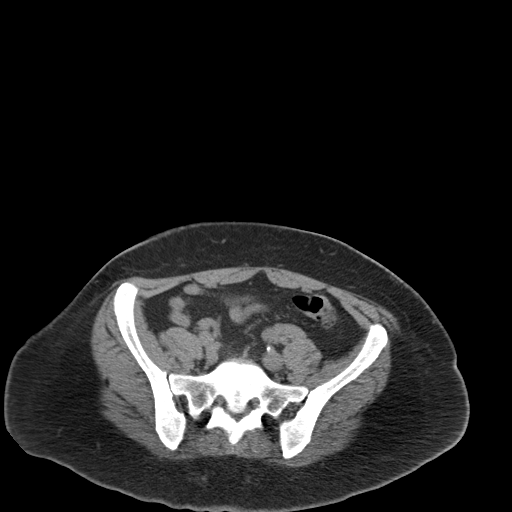
[im 41/96  soft-tissue]
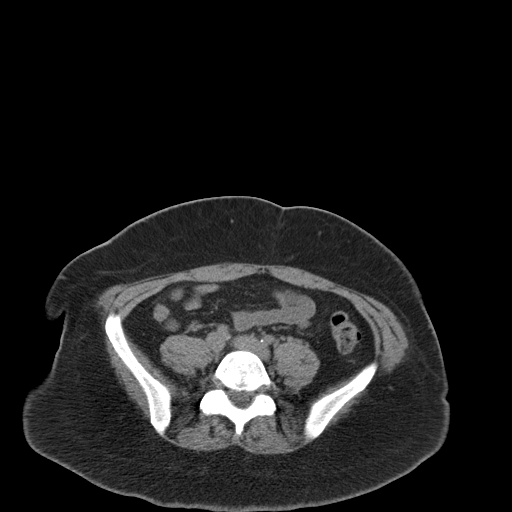
[im 51/96  soft-tissue]
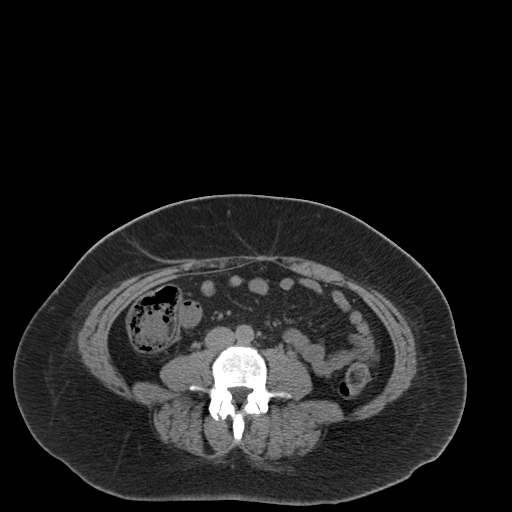
[im 56/96  soft-tissue]
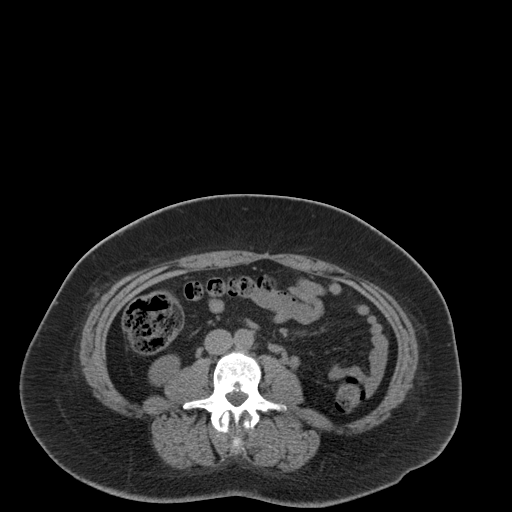
[im 61/96  soft-tissue]
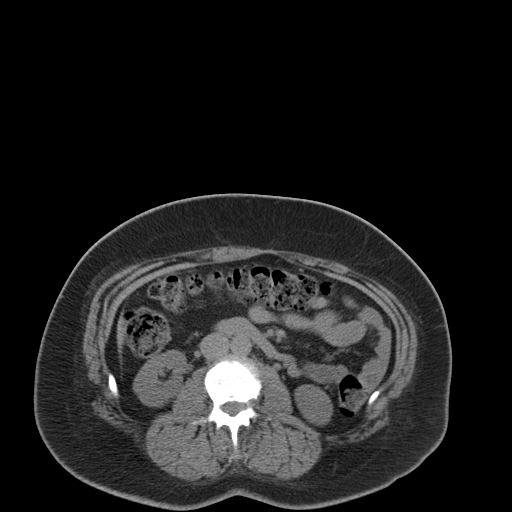
[im 61/96  bone]
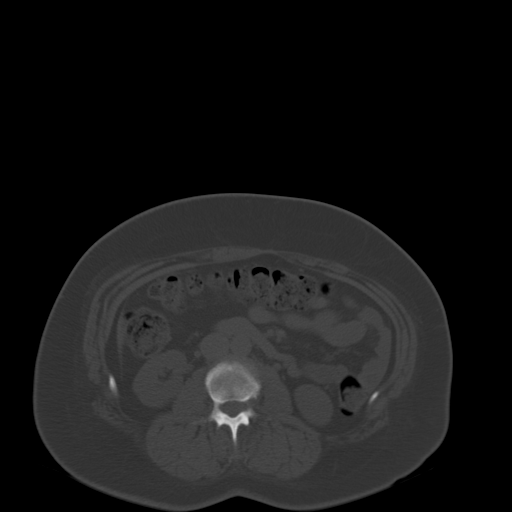
[im 71/96  soft-tissue]
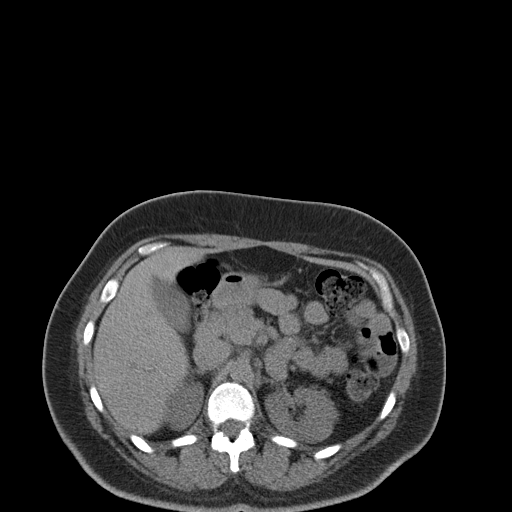
[im 76/96  soft-tissue]
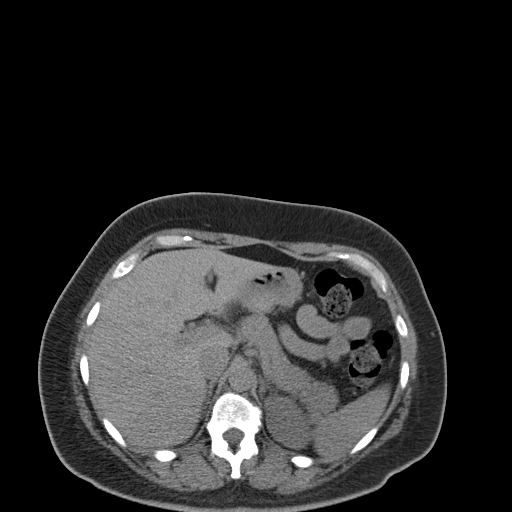
[im 81/96  soft-tissue]
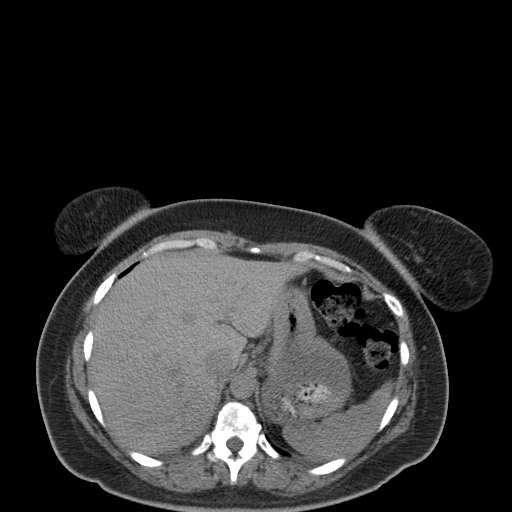
[im 91/96  soft-tissue]
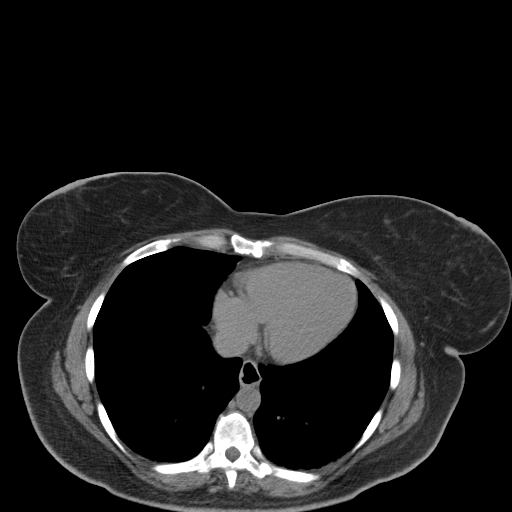

[Series 4: coronal · coronal · 0.89mm/px · 3 of 108 slices shown]
[im 36/108  soft-tissue]
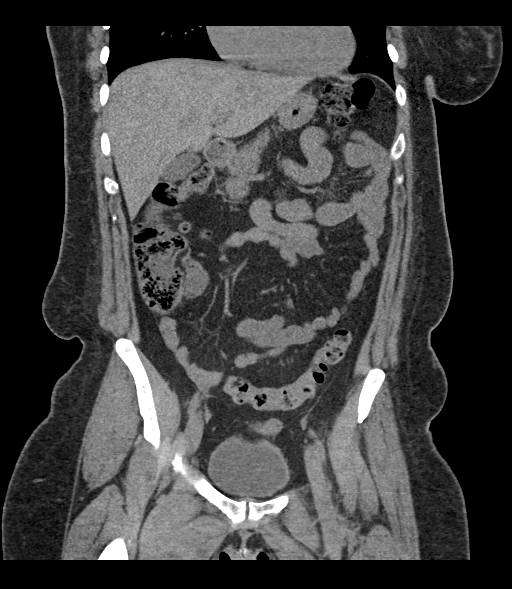
[im 48/108  soft-tissue]
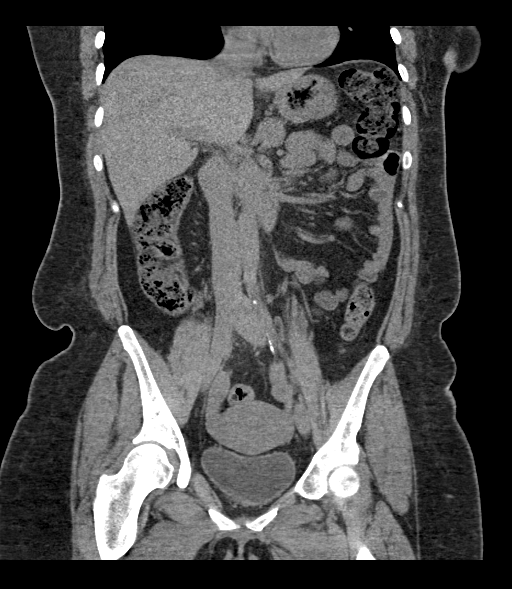
[im 60/108  soft-tissue]
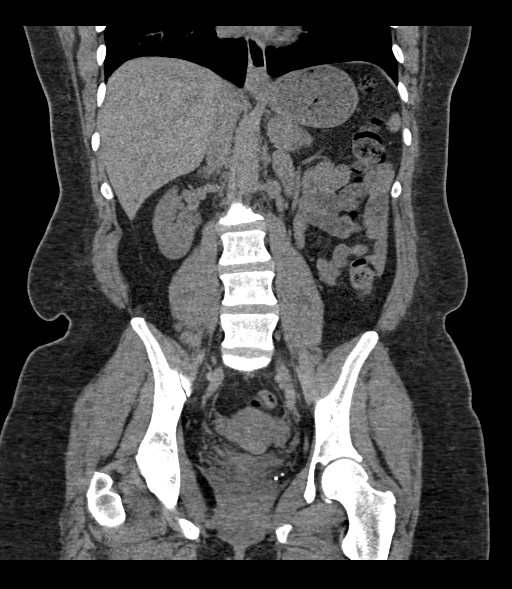

[16 of 46 positions shown; findings below may reference images not displayed]

FINDINGS: Lower chest: The lung bases are clear. The heart size is normal.

Hepatobiliary: The liver is normal. Normal gallbladder.There is no
biliary ductal dilation.

Pancreas: Normal contours without ductal dilatation. No
peripancreatic fluid collection.

Spleen: No splenic laceration or hematoma.

Adrenals/Urinary Tract:

--Adrenal glands: No adrenal hemorrhage.

--Right kidney/ureter: No hydronephrosis or perinephric hematoma.

--Left kidney/ureter: No hydronephrosis or perinephric hematoma.

--Urinary bladder: Unremarkable.

Stomach/Bowel:

--Stomach/Duodenum: No hiatal hernia or other gastric abnormality.
Normal duodenal course and caliber.

--Small bowel: No dilatation or inflammation.

--Colon: No focal abnormality.

--Appendix: Normal.

Vascular/Lymphatic: Normal course and caliber of the major abdominal
vessels.

--No retroperitoneal lymphadenopathy.

--No mesenteric lymphadenopathy.

--No pelvic or inguinal lymphadenopathy.

Reproductive: There are multiple low-attenuation areas in the
uterine fundus favored to represent fibroids.

Other: No ascites or free air. The abdominal wall is normal.

Musculoskeletal. No acute displaced fractures.
IMPRESSION: 1. No acute abnormality detected. No radiopaque kidney stones. No
hydronephrosis.
2. Normal appendix in the right lower quadrant.
3. Fibroid uterus.
